# Patient Record
Sex: Male | Born: 1960 | Race: White | Hispanic: No | Marital: Married | Smoking: Never smoker
Health system: Southern US, Community
[De-identification: ages and names within clinical notes are randomized; demographics above are authoritative.]

## PROBLEM LIST (undated history)

## (undated) DIAGNOSIS — G039 Meningitis, unspecified: Secondary | ICD-10-CM

## (undated) HISTORY — PX: SINOSCOPY: SHX187

## (undated) HISTORY — PX: BRAIN SURGERY: SHX531

## (undated) HISTORY — PX: SPINE SURGERY: SHX786

---

## 2001-03-24 ENCOUNTER — Encounter: Payer: Self-pay | Admitting: Internal Medicine

## 2001-03-24 ENCOUNTER — Ambulatory Visit (HOSPITAL_COMMUNITY): Admission: RE | Admit: 2001-03-24 | Discharge: 2001-03-24 | Payer: Self-pay | Admitting: Internal Medicine

## 2001-10-16 ENCOUNTER — Emergency Department (HOSPITAL_COMMUNITY): Admission: EM | Admit: 2001-10-16 | Discharge: 2001-10-16 | Payer: Self-pay | Admitting: Emergency Medicine

## 2005-02-23 ENCOUNTER — Encounter: Admission: RE | Admit: 2005-02-23 | Discharge: 2005-02-23 | Payer: Self-pay | Admitting: Occupational Medicine

## 2006-01-12 ENCOUNTER — Ambulatory Visit (HOSPITAL_BASED_OUTPATIENT_CLINIC_OR_DEPARTMENT_OTHER): Admission: RE | Admit: 2006-01-12 | Discharge: 2006-01-12 | Payer: Self-pay | Admitting: Orthopedic Surgery

## 2006-01-20 ENCOUNTER — Encounter (HOSPITAL_COMMUNITY): Admission: RE | Admit: 2006-01-20 | Discharge: 2006-02-19 | Payer: Self-pay | Admitting: Orthopedic Surgery

## 2006-02-21 ENCOUNTER — Encounter (HOSPITAL_COMMUNITY): Admission: RE | Admit: 2006-02-21 | Discharge: 2006-03-23 | Payer: Self-pay | Admitting: Orthopedic Surgery

## 2006-07-11 ENCOUNTER — Ambulatory Visit (HOSPITAL_COMMUNITY): Admission: RE | Admit: 2006-07-11 | Discharge: 2006-07-11 | Payer: Self-pay | Admitting: Internal Medicine

## 2008-01-23 ENCOUNTER — Ambulatory Visit (HOSPITAL_COMMUNITY): Admission: RE | Admit: 2008-01-23 | Discharge: 2008-01-23 | Payer: Self-pay | Admitting: Internal Medicine

## 2009-01-07 ENCOUNTER — Ambulatory Visit (HOSPITAL_COMMUNITY): Admission: RE | Admit: 2009-01-07 | Discharge: 2009-01-07 | Payer: Self-pay | Admitting: Internal Medicine

## 2009-02-03 ENCOUNTER — Encounter (HOSPITAL_COMMUNITY): Admission: RE | Admit: 2009-02-03 | Discharge: 2009-03-05 | Payer: Self-pay | Admitting: Orthopedic Surgery

## 2010-11-20 NOTE — Op Note (Signed)
Glenn Ramos, Glenn Ramos              ACCOUNT NO.:  [id number]   MEDICAL RECORD NO.:  [id number]          PATIENT TYPE:  AMB   LOCATION:  DSC                          FACILITY:  [facility]   PHYSICIAN:  Loreta Ave, M.D. DATE OF BIRTH:  1960-11-11   DATE OF PROCEDURE:  01/12/2006  DATE OF DISCHARGE:                                 OPERATIVE REPORT   PREOPERATIVE DIAGNOSES:  1.  Right shoulder impingement.  2.  Partial tear in rotator cuff.  3.  Distal clavicle osteolysis.   POSTOPERATIVE DIAGNOSES:  1.  Right shoulder impingement, without full thickness cuff tear.  2.  Partial tear in rotator cuff.  3.  Distal clavicle osteolysis.   PROCEDURE:  1.  Right shoulder exam under anesthesia arthroscopy.  2.  Debridement of rotator cuff.  3.  Bursectomy.  4.  Acromioplasty.  5.  Coracoacromial ligament release.  6.  Excision of distal clavicle.   SURGEON:  Loreta Ave, M.D.   ASSISTANT:  Genene Churn. Denton Meek.   ANESTHESIA:  General.   BLOOD REPLACED:  Minimal.   SPECIMENS:  None.   __________   COMPLICATIONS:  None.   DRESSING:  Soft compression with sling.   DESCRIPTION OF PROCEDURE:  Patient brought to the operating room and placed  on the operating room table in supine position.  After adequate anesthesia  was obtained, right shoulder examined.  Full motion was taken of shoulder.  Placed in the beach chair position and shoulder position and prepped and  draped in the usual sterile fashion.  Three standard portals, anterior and  posterior and lateral.  Shoulder __________  distended and inspected.  The  anterior of the shoulder articular cartilage, capsular, ligamentous  structure, labrum, biceps tendon, biceps anchor are all intact.  All rotator  cuff tendons look good.  From above, typical impingement and abrasive  partial tearing of the top of the cuff supraspinatus tendon in the anterior  aspect.  Impingement from the acromion, thickened bursa and AC joint.   Bursa  was resected, cuff debrided.  No full thickness tears.  Acromioplasty from a  type 3 to a type 1 acromion shaver and high speed bur.  CA ligament resected  out with release with cautery.  Described grade 4 changes.  A periarticular  spur along with a centimeter of clavicle were resected with shaver and a  high speed bur.  Active bleeding __________ decompression, distal clavicle  excision, cuff debridement confirmed with 4 portals.  Instruments are fully  removed.  Out portals closed with #4-0 nylon.  Several compression dressing  applied.  Sling applied.  Anesthesia reversed.  Brought to recovery room.  Tolerated the surgery well.  No complications.      Loreta Ave, M.D.  Electronically Signed     DFM/MEDQ  D:  01/12/2006  T:  01/13/2006  Job:  [id number]

## 2016-01-05 DIAGNOSIS — Z125 Encounter for screening for malignant neoplasm of prostate: Secondary | ICD-10-CM | POA: Diagnosis not present

## 2016-01-05 DIAGNOSIS — Z0001 Encounter for general adult medical examination with abnormal findings: Secondary | ICD-10-CM | POA: Diagnosis not present

## 2016-01-13 DIAGNOSIS — Z0001 Encounter for general adult medical examination with abnormal findings: Secondary | ICD-10-CM | POA: Diagnosis not present

## 2016-01-13 DIAGNOSIS — E785 Hyperlipidemia, unspecified: Secondary | ICD-10-CM | POA: Diagnosis not present

## 2016-01-13 DIAGNOSIS — Z6830 Body mass index (BMI) 30.0-30.9, adult: Secondary | ICD-10-CM | POA: Diagnosis not present

## 2016-01-13 DIAGNOSIS — M722 Plantar fascial fibromatosis: Secondary | ICD-10-CM | POA: Diagnosis not present

## 2016-04-07 DIAGNOSIS — Z23 Encounter for immunization: Secondary | ICD-10-CM | POA: Diagnosis not present

## 2016-04-07 DIAGNOSIS — K219 Gastro-esophageal reflux disease without esophagitis: Secondary | ICD-10-CM | POA: Diagnosis not present

## 2016-05-18 DIAGNOSIS — K219 Gastro-esophageal reflux disease without esophagitis: Secondary | ICD-10-CM | POA: Diagnosis not present

## 2017-01-19 DIAGNOSIS — Z79899 Other long term (current) drug therapy: Secondary | ICD-10-CM | POA: Diagnosis not present

## 2017-01-19 DIAGNOSIS — E785 Hyperlipidemia, unspecified: Secondary | ICD-10-CM | POA: Diagnosis not present

## 2017-01-19 DIAGNOSIS — Z125 Encounter for screening for malignant neoplasm of prostate: Secondary | ICD-10-CM | POA: Diagnosis not present

## 2017-01-19 DIAGNOSIS — G4089 Other seizures: Secondary | ICD-10-CM | POA: Diagnosis not present

## 2017-01-27 DIAGNOSIS — Z6830 Body mass index (BMI) 30.0-30.9, adult: Secondary | ICD-10-CM | POA: Diagnosis not present

## 2017-01-27 DIAGNOSIS — E785 Hyperlipidemia, unspecified: Secondary | ICD-10-CM | POA: Diagnosis not present

## 2017-01-27 DIAGNOSIS — M79671 Pain in right foot: Secondary | ICD-10-CM | POA: Diagnosis not present

## 2017-01-27 DIAGNOSIS — Z0001 Encounter for general adult medical examination with abnormal findings: Secondary | ICD-10-CM | POA: Diagnosis not present

## 2018-02-14 DIAGNOSIS — K219 Gastro-esophageal reflux disease without esophagitis: Secondary | ICD-10-CM | POA: Diagnosis not present

## 2018-02-14 DIAGNOSIS — E785 Hyperlipidemia, unspecified: Secondary | ICD-10-CM | POA: Diagnosis not present

## 2018-02-14 DIAGNOSIS — Z79899 Other long term (current) drug therapy: Secondary | ICD-10-CM | POA: Diagnosis not present

## 2018-02-14 DIAGNOSIS — Z125 Encounter for screening for malignant neoplasm of prostate: Secondary | ICD-10-CM | POA: Diagnosis not present

## 2018-02-20 DIAGNOSIS — E785 Hyperlipidemia, unspecified: Secondary | ICD-10-CM | POA: Diagnosis not present

## 2018-02-20 DIAGNOSIS — Z6831 Body mass index (BMI) 31.0-31.9, adult: Secondary | ICD-10-CM | POA: Diagnosis not present

## 2018-02-20 DIAGNOSIS — Z8669 Personal history of other diseases of the nervous system and sense organs: Secondary | ICD-10-CM | POA: Diagnosis not present

## 2018-02-20 DIAGNOSIS — Z0001 Encounter for general adult medical examination with abnormal findings: Secondary | ICD-10-CM | POA: Diagnosis not present

## 2018-04-06 DIAGNOSIS — Z23 Encounter for immunization: Secondary | ICD-10-CM | POA: Diagnosis not present

## 2018-07-26 ENCOUNTER — Encounter (HOSPITAL_COMMUNITY): Payer: Self-pay | Admitting: *Deleted

## 2018-07-26 ENCOUNTER — Other Ambulatory Visit: Payer: Self-pay

## 2018-07-26 ENCOUNTER — Emergency Department (HOSPITAL_COMMUNITY)
Admission: EM | Admit: 2018-07-26 | Discharge: 2018-07-26 | Disposition: A | Discharge status: Home / Self Care | Payer: No Typology Code available for payment source | Attending: Emergency Medicine | Admitting: Emergency Medicine

## 2018-07-26 DIAGNOSIS — S0502XA Injury of conjunctiva and corneal abrasion without foreign body, left eye, initial encounter: Secondary | ICD-10-CM | POA: Diagnosis not present

## 2018-07-26 DIAGNOSIS — S0501XA Injury of conjunctiva and corneal abrasion without foreign body, right eye, initial encounter: Secondary | ICD-10-CM | POA: Insufficient documentation

## 2018-07-26 DIAGNOSIS — Y999 Unspecified external cause status: Secondary | ICD-10-CM | POA: Diagnosis not present

## 2018-07-26 DIAGNOSIS — Y929 Unspecified place or not applicable: Secondary | ICD-10-CM | POA: Diagnosis not present

## 2018-07-26 DIAGNOSIS — W010XXA Fall on same level from slipping, tripping and stumbling without subsequent striking against object, initial encounter: Secondary | ICD-10-CM | POA: Diagnosis not present

## 2018-07-26 DIAGNOSIS — S0590XA Unspecified injury of unspecified eye and orbit, initial encounter: Secondary | ICD-10-CM

## 2018-07-26 DIAGNOSIS — Z77098 Contact with and (suspected) exposure to other hazardous, chiefly nonmedicinal, chemicals: Secondary | ICD-10-CM | POA: Diagnosis present

## 2018-07-26 DIAGNOSIS — Y9389 Activity, other specified: Secondary | ICD-10-CM | POA: Diagnosis not present

## 2018-07-26 HISTORY — DX: Meningitis, unspecified: G03.9

## 2018-07-26 MED ORDER — TETRACAINE HCL 0.5 % OP SOLN
2.0000 [drp] | Freq: Once | OPHTHALMIC | Status: AC
  Administered 2018-07-26: 2 [drp] via OPHTHALMIC

## 2018-07-26 MED ORDER — TETRACAINE HCL 0.5 % OP SOLN
OPHTHALMIC | Status: AC
  Administered 2018-07-26: 2 [drp] via OPHTHALMIC
  Filled 2018-07-26: qty 4

## 2018-07-26 MED ORDER — HYDROCODONE-ACETAMINOPHEN 5-325 MG PO TABS: 1.0000 | ORAL_TABLET | Freq: Four times a day (QID) | ORAL | 0 refills | Status: AC | PRN

## 2018-07-26 MED ORDER — FLUORESCEIN SODIUM 1 MG OP STRP
ORAL_STRIP | OPHTHALMIC | Status: AC
  Filled 2018-07-26: qty 2

## 2018-07-26 MED ORDER — FLUORESCEIN SODIUM 1 MG OP STRP: 1.0000 | ORAL_STRIP | Freq: Once | OPHTHALMIC | Status: DC

## 2018-07-26 MED ORDER — ERYTHROMYCIN 5 MG/GM OP OINT
TOPICAL_OINTMENT | Freq: Once | OPHTHALMIC | Status: AC
  Administered 2018-07-26: 22:00:00 via OPHTHALMIC
  Filled 2018-07-26: qty 3.5

## 2018-07-26 NOTE — ED Triage Notes (Signed)
Pt reports he works at [employer] and got cationic dye in his bilateral eyes, more in the left. Pt denies blurry vision. Pt reports extreme pain with opening his eyes. Pt has bluish discoloration around his eyes which he reports is the color of the cationic dye. Pt reports he rinsed his eyes out at work with water for approximately 5 minutes prior to coming to ED.

## 2018-07-26 NOTE — ED Notes (Signed)
Bilateral eyes irrigated for second time to to low ph

## 2018-07-26 NOTE — ED Provider Notes (Signed)
[facility] EMERGENCY DEPARTMENT Provider Note   CSN: [id number] Arrival date & time: 07/26/18  1833     History   Chief Complaint Chief Complaint  Patient presents with  . Eye Injury    HPI Glenn Ramos is a 58 y.o. male.  Patient tripped and fell and splashed a blue dye into both of his eyes.  He complains of pain to both eyes.  They irrigated his eyes at work.  Poison control was called and said he needed to irrigate his eyes more.  The history is provided by the patient. No language interpreter was used.  Eye Injury  This is a new problem. The current episode started less than 1 hour ago. The problem occurs rarely. The problem has been resolved. Pertinent negatives include no chest pain, no abdominal pain and no headaches. Nothing aggravates the symptoms. Nothing relieves the symptoms. He has tried nothing for the symptoms. The treatment provided no relief.    Past Medical History:  Diagnosis Date  . Meningitis spinal     There are no active problems to display for this patient.   Past Surgical History:  Procedure Laterality Date  . BRAIN SURGERY    . SPINE SURGERY          Home Medications    Prior to Admission medications   Medication Sig Start Date End Date Taking? Authorizing Provider  HYDROcodone-acetaminophen (NORCO/VICODIN) 5-325 MG tablet Take 1 tablet by mouth every 6 (six) hours as needed for moderate pain. 07/26/18   Bethann Berkshire, MD    Family History No family history on file.  Social History Social History   Tobacco Use  . Smoking status: Never Smoker  . Smokeless tobacco: Never Used  Substance Use Topics  . Alcohol use: Never    Frequency: Never  . Drug use: Never     Allergies   Patient has no known allergies.   Review of Systems Review of Systems  Constitutional: Negative for appetite change and fatigue.  HENT: Negative for congestion, ear discharge and sinus pressure.        Pain in both eyes  Eyes: Negative for  discharge.  Respiratory: Negative for cough.   Cardiovascular: Negative for chest pain.  Gastrointestinal: Negative for abdominal pain and diarrhea.  Genitourinary: Negative for frequency and hematuria.  Musculoskeletal: Negative for back pain.  Skin: Negative for rash.  Neurological: Negative for seizures and headaches.  Psychiatric/Behavioral: Negative for hallucinations.     Physical Exam Updated Vital Signs BP (!) 122/94 (BP Location: Right Arm)   Pulse 80   Temp 98 F (36.7 C) (Oral)   Resp 16   Ht 5\' 8"  (1.727 m)   Wt 88.5 kg   SpO2 99%   BMI 29.65 kg/m   Physical Exam Vitals signs and nursing note reviewed.  Constitutional:      Appearance: He is well-developed.  HENT:     Head: Normocephalic.     Nose: Nose normal.  Eyes:     Comments: Bilateral conjunctiva inflamed.  Pupils 3 mm and reactive.  Patient states he no longer has any blurred vision.  Small amount of blue dye in conjunctiva both eyes.  Large corneal abrasion sent over his left eye and small corneal abrasion on the right eye  Neck:     Musculoskeletal: Neck supple.     Thyroid: No thyromegaly.  Cardiovascular:     Rate and Rhythm: Normal rate and regular rhythm.     Heart sounds: No murmur.  No friction rub. No gallop.   Pulmonary:     Breath sounds: No stridor. No wheezing or rales.  Chest:     Chest wall: No tenderness.  Abdominal:     General: There is no distension.     Tenderness: There is no abdominal tenderness. There is no rebound.  Musculoskeletal: Normal range of motion.  Lymphadenopathy:     Cervical: No cervical adenopathy.  Skin:    Findings: No erythema or rash.  Neurological:     Mental Status: He is oriented to person, place, and time.     Motor: No abnormal muscle tone.     Coordination: Coordination normal.  Psychiatric:        Behavior: Behavior normal.      ED Treatments / Results  Labs (all labs ordered are listed, but only abnormal results are displayed) Labs  Reviewed - No data to display  EKG None  Radiology No results found.  Procedures Procedures (including critical care time)  Medications Ordered in ED Medications  fluorescein ophthalmic strip 1 strip (has no administration in time range)  erythromycin ophthalmic ointment (has no administration in time range)  tetracaine (PONTOCAINE) 0.5 % ophthalmic solution 2 drop (2 drops Both Eyes Given 07/26/18 1911)     Initial Impression / Assessment and Plan / ED Course  I have reviewed the triage vital signs and the nursing notes.  Pertinent labs & imaging results that were available during my care of the patient were reviewed by me and considered in my medical decision making (see chart for details).    Patient's eyes were irrigated significantly both eyes had 2 L of irrigation.  Patient's pH is 7-8 after irrigation of 2 L per.  Erythromycin ointment was applied to both eyes and he will use it every 2 hours and he will follow-up with the ophthalmologist tomorrow.  Final Clinical Impressions(s) / ED Diagnoses   Final diagnoses:  Eye injury, initial encounter    ED Discharge Orders         Ordered    HYDROcodone-acetaminophen (NORCO/VICODIN) 5-325 MG tablet  Every 6 hours PRN     07/26/18 2141           Bethann BerkshireZammit, Nataly Pacifico, MD 07/26/18 2146

## 2018-07-26 NOTE — ED Notes (Signed)
Dr. Zammit notified of pt. 

## 2018-07-26 NOTE — ED Notes (Addendum)
Posion control called. Posion control recommends pt's eyes be irrigated with the morgan lenses with 500-105700ml of NS. Afterwards, check for surface injury and check pH in eyes 30 minutes after the irrigation is completed. If injury has occurred, appropriate antibiotics need to be started. Pain management needs to be addressed. It is highly recommended that pt see opthalmology tonight. If this is not possible, pt should follow up with them tomorrow.

## 2018-07-26 NOTE — ED Notes (Signed)
Morgan lens applied to bilateral eyes

## 2018-07-26 NOTE — Discharge Instructions (Addendum)
Use the ointment every 2 hours to both eyes.  Call Dr. Laruth Bouchard office in the morning and get an appointment for tomorrow.  Tell the office that you were seen in the emergency department last night and Dr. Dione Booze wanted you seen today

## 2018-07-27 MED FILL — Hydrocodone-Acetaminophen Tab 5-325 MG: ORAL | Qty: 6 | Status: AC

## 2018-11-06 DIAGNOSIS — M25512 Pain in left shoulder: Secondary | ICD-10-CM | POA: Diagnosis not present

## 2018-12-07 DIAGNOSIS — N3000 Acute cystitis without hematuria: Secondary | ICD-10-CM | POA: Diagnosis not present

## 2018-12-07 DIAGNOSIS — N390 Urinary tract infection, site not specified: Secondary | ICD-10-CM | POA: Diagnosis not present

## 2018-12-11 ENCOUNTER — Other Ambulatory Visit (HOSPITAL_COMMUNITY): Payer: Self-pay | Admitting: Internal Medicine

## 2018-12-11 ENCOUNTER — Other Ambulatory Visit: Payer: Self-pay | Admitting: Internal Medicine

## 2018-12-11 DIAGNOSIS — N200 Calculus of kidney: Secondary | ICD-10-CM

## 2018-12-14 ENCOUNTER — Other Ambulatory Visit (HOSPITAL_COMMUNITY): Payer: Self-pay | Admitting: Internal Medicine

## 2018-12-14 ENCOUNTER — Other Ambulatory Visit: Payer: Self-pay

## 2018-12-14 ENCOUNTER — Ambulatory Visit (HOSPITAL_COMMUNITY)
Admission: RE | Admit: 2018-12-14 | Discharge: 2018-12-14 | Disposition: A | Discharge status: Home / Self Care | Payer: BC Managed Care – PPO | Source: Ambulatory Visit | Attending: Internal Medicine | Admitting: Internal Medicine

## 2018-12-14 DIAGNOSIS — N200 Calculus of kidney: Secondary | ICD-10-CM

## 2018-12-14 DIAGNOSIS — N201 Calculus of ureter: Secondary | ICD-10-CM | POA: Diagnosis not present

## 2018-12-14 MED ORDER — SODIUM CHLORIDE (PF) 0.9 % IJ SOLN
INTRAMUSCULAR | Status: AC
  Filled 2018-12-14: qty 50

## 2018-12-18 DIAGNOSIS — M25512 Pain in left shoulder: Secondary | ICD-10-CM | POA: Diagnosis not present

## 2018-12-23 DIAGNOSIS — M25512 Pain in left shoulder: Secondary | ICD-10-CM | POA: Diagnosis not present

## 2018-12-25 DIAGNOSIS — M25512 Pain in left shoulder: Secondary | ICD-10-CM | POA: Diagnosis not present

## 2019-01-04 DIAGNOSIS — N201 Calculus of ureter: Secondary | ICD-10-CM | POA: Diagnosis not present

## 2019-01-25 DIAGNOSIS — M19012 Primary osteoarthritis, left shoulder: Secondary | ICD-10-CM | POA: Diagnosis not present

## 2019-01-25 DIAGNOSIS — G8918 Other acute postprocedural pain: Secondary | ICD-10-CM | POA: Diagnosis not present

## 2019-01-25 DIAGNOSIS — M7522 Bicipital tendinitis, left shoulder: Secondary | ICD-10-CM | POA: Diagnosis not present

## 2019-01-25 DIAGNOSIS — M75122 Complete rotator cuff tear or rupture of left shoulder, not specified as traumatic: Secondary | ICD-10-CM | POA: Diagnosis not present

## 2019-01-25 DIAGNOSIS — S46012A Strain of muscle(s) and tendon(s) of the rotator cuff of left shoulder, initial encounter: Secondary | ICD-10-CM | POA: Diagnosis not present

## 2019-01-25 DIAGNOSIS — M7542 Impingement syndrome of left shoulder: Secondary | ICD-10-CM | POA: Diagnosis not present

## 2019-01-25 DIAGNOSIS — M24112 Other articular cartilage disorders, left shoulder: Secondary | ICD-10-CM | POA: Diagnosis not present

## 2019-02-05 DIAGNOSIS — M19012 Primary osteoarthritis, left shoulder: Secondary | ICD-10-CM | POA: Diagnosis not present

## 2019-02-09 DIAGNOSIS — M6281 Muscle weakness (generalized): Secondary | ICD-10-CM | POA: Diagnosis not present

## 2019-02-09 DIAGNOSIS — M25512 Pain in left shoulder: Secondary | ICD-10-CM | POA: Diagnosis not present

## 2019-02-09 DIAGNOSIS — M25612 Stiffness of left shoulder, not elsewhere classified: Secondary | ICD-10-CM | POA: Diagnosis not present

## 2019-02-09 DIAGNOSIS — M75122 Complete rotator cuff tear or rupture of left shoulder, not specified as traumatic: Secondary | ICD-10-CM | POA: Diagnosis not present

## 2019-02-16 DIAGNOSIS — M25512 Pain in left shoulder: Secondary | ICD-10-CM | POA: Diagnosis not present

## 2019-02-16 DIAGNOSIS — M75122 Complete rotator cuff tear or rupture of left shoulder, not specified as traumatic: Secondary | ICD-10-CM | POA: Diagnosis not present

## 2019-02-16 DIAGNOSIS — M25612 Stiffness of left shoulder, not elsewhere classified: Secondary | ICD-10-CM | POA: Diagnosis not present

## 2019-02-16 DIAGNOSIS — M6281 Muscle weakness (generalized): Secondary | ICD-10-CM | POA: Diagnosis not present

## 2019-02-22 DIAGNOSIS — M25612 Stiffness of left shoulder, not elsewhere classified: Secondary | ICD-10-CM | POA: Diagnosis not present

## 2019-02-22 DIAGNOSIS — M25512 Pain in left shoulder: Secondary | ICD-10-CM | POA: Diagnosis not present

## 2019-02-22 DIAGNOSIS — M75122 Complete rotator cuff tear or rupture of left shoulder, not specified as traumatic: Secondary | ICD-10-CM | POA: Diagnosis not present

## 2019-02-22 DIAGNOSIS — M6281 Muscle weakness (generalized): Secondary | ICD-10-CM | POA: Diagnosis not present

## 2019-03-01 DIAGNOSIS — M6281 Muscle weakness (generalized): Secondary | ICD-10-CM | POA: Diagnosis not present

## 2019-03-01 DIAGNOSIS — M75122 Complete rotator cuff tear or rupture of left shoulder, not specified as traumatic: Secondary | ICD-10-CM | POA: Diagnosis not present

## 2019-03-01 DIAGNOSIS — M25512 Pain in left shoulder: Secondary | ICD-10-CM | POA: Diagnosis not present

## 2019-03-01 DIAGNOSIS — M25612 Stiffness of left shoulder, not elsewhere classified: Secondary | ICD-10-CM | POA: Diagnosis not present

## 2019-03-05 DIAGNOSIS — M25512 Pain in left shoulder: Secondary | ICD-10-CM | POA: Diagnosis not present

## 2019-03-05 DIAGNOSIS — M6281 Muscle weakness (generalized): Secondary | ICD-10-CM | POA: Diagnosis not present

## 2019-03-05 DIAGNOSIS — M75122 Complete rotator cuff tear or rupture of left shoulder, not specified as traumatic: Secondary | ICD-10-CM | POA: Diagnosis not present

## 2019-03-05 DIAGNOSIS — M25612 Stiffness of left shoulder, not elsewhere classified: Secondary | ICD-10-CM | POA: Diagnosis not present

## 2019-03-09 DIAGNOSIS — M75122 Complete rotator cuff tear or rupture of left shoulder, not specified as traumatic: Secondary | ICD-10-CM | POA: Diagnosis not present

## 2019-03-09 DIAGNOSIS — M25512 Pain in left shoulder: Secondary | ICD-10-CM | POA: Diagnosis not present

## 2019-03-09 DIAGNOSIS — M6281 Muscle weakness (generalized): Secondary | ICD-10-CM | POA: Diagnosis not present

## 2019-03-09 DIAGNOSIS — M25612 Stiffness of left shoulder, not elsewhere classified: Secondary | ICD-10-CM | POA: Diagnosis not present

## 2019-03-13 DIAGNOSIS — M75122 Complete rotator cuff tear or rupture of left shoulder, not specified as traumatic: Secondary | ICD-10-CM | POA: Diagnosis not present

## 2019-03-13 DIAGNOSIS — M25612 Stiffness of left shoulder, not elsewhere classified: Secondary | ICD-10-CM | POA: Diagnosis not present

## 2019-03-13 DIAGNOSIS — M25512 Pain in left shoulder: Secondary | ICD-10-CM | POA: Diagnosis not present

## 2019-03-13 DIAGNOSIS — M6281 Muscle weakness (generalized): Secondary | ICD-10-CM | POA: Diagnosis not present

## 2019-03-19 DIAGNOSIS — M75122 Complete rotator cuff tear or rupture of left shoulder, not specified as traumatic: Secondary | ICD-10-CM | POA: Diagnosis not present

## 2019-03-19 DIAGNOSIS — M6281 Muscle weakness (generalized): Secondary | ICD-10-CM | POA: Diagnosis not present

## 2019-03-19 DIAGNOSIS — M25612 Stiffness of left shoulder, not elsewhere classified: Secondary | ICD-10-CM | POA: Diagnosis not present

## 2019-03-19 DIAGNOSIS — M25512 Pain in left shoulder: Secondary | ICD-10-CM | POA: Diagnosis not present

## 2019-03-22 DIAGNOSIS — M25612 Stiffness of left shoulder, not elsewhere classified: Secondary | ICD-10-CM | POA: Diagnosis not present

## 2019-03-22 DIAGNOSIS — M75122 Complete rotator cuff tear or rupture of left shoulder, not specified as traumatic: Secondary | ICD-10-CM | POA: Diagnosis not present

## 2019-03-22 DIAGNOSIS — M25512 Pain in left shoulder: Secondary | ICD-10-CM | POA: Diagnosis not present

## 2019-03-22 DIAGNOSIS — M6281 Muscle weakness (generalized): Secondary | ICD-10-CM | POA: Diagnosis not present

## 2019-04-03 DIAGNOSIS — M75122 Complete rotator cuff tear or rupture of left shoulder, not specified as traumatic: Secondary | ICD-10-CM | POA: Diagnosis not present

## 2019-04-03 DIAGNOSIS — M6281 Muscle weakness (generalized): Secondary | ICD-10-CM | POA: Diagnosis not present

## 2019-04-03 DIAGNOSIS — M25612 Stiffness of left shoulder, not elsewhere classified: Secondary | ICD-10-CM | POA: Diagnosis not present

## 2019-04-03 DIAGNOSIS — M25512 Pain in left shoulder: Secondary | ICD-10-CM | POA: Diagnosis not present

## 2019-04-05 DIAGNOSIS — M25512 Pain in left shoulder: Secondary | ICD-10-CM | POA: Diagnosis not present

## 2019-04-05 DIAGNOSIS — M25612 Stiffness of left shoulder, not elsewhere classified: Secondary | ICD-10-CM | POA: Diagnosis not present

## 2019-04-05 DIAGNOSIS — M6281 Muscle weakness (generalized): Secondary | ICD-10-CM | POA: Diagnosis not present

## 2019-04-05 DIAGNOSIS — M75122 Complete rotator cuff tear or rupture of left shoulder, not specified as traumatic: Secondary | ICD-10-CM | POA: Diagnosis not present

## 2019-04-10 DIAGNOSIS — M25512 Pain in left shoulder: Secondary | ICD-10-CM | POA: Diagnosis not present

## 2019-04-10 DIAGNOSIS — M6281 Muscle weakness (generalized): Secondary | ICD-10-CM | POA: Diagnosis not present

## 2019-04-10 DIAGNOSIS — M75122 Complete rotator cuff tear or rupture of left shoulder, not specified as traumatic: Secondary | ICD-10-CM | POA: Diagnosis not present

## 2019-04-10 DIAGNOSIS — M25612 Stiffness of left shoulder, not elsewhere classified: Secondary | ICD-10-CM | POA: Diagnosis not present

## 2019-04-13 DIAGNOSIS — M25512 Pain in left shoulder: Secondary | ICD-10-CM | POA: Diagnosis not present

## 2019-04-13 DIAGNOSIS — M6281 Muscle weakness (generalized): Secondary | ICD-10-CM | POA: Diagnosis not present

## 2019-04-13 DIAGNOSIS — M75122 Complete rotator cuff tear or rupture of left shoulder, not specified as traumatic: Secondary | ICD-10-CM | POA: Diagnosis not present

## 2019-04-13 DIAGNOSIS — M25612 Stiffness of left shoulder, not elsewhere classified: Secondary | ICD-10-CM | POA: Diagnosis not present

## 2019-04-16 DIAGNOSIS — M75122 Complete rotator cuff tear or rupture of left shoulder, not specified as traumatic: Secondary | ICD-10-CM | POA: Diagnosis not present

## 2019-04-16 DIAGNOSIS — M6281 Muscle weakness (generalized): Secondary | ICD-10-CM | POA: Diagnosis not present

## 2019-04-16 DIAGNOSIS — M25512 Pain in left shoulder: Secondary | ICD-10-CM | POA: Diagnosis not present

## 2019-04-16 DIAGNOSIS — M25612 Stiffness of left shoulder, not elsewhere classified: Secondary | ICD-10-CM | POA: Diagnosis not present

## 2019-04-17 DIAGNOSIS — Z23 Encounter for immunization: Secondary | ICD-10-CM | POA: Diagnosis not present

## 2019-04-20 DIAGNOSIS — M6281 Muscle weakness (generalized): Secondary | ICD-10-CM | POA: Diagnosis not present

## 2019-04-20 DIAGNOSIS — M25612 Stiffness of left shoulder, not elsewhere classified: Secondary | ICD-10-CM | POA: Diagnosis not present

## 2019-04-20 DIAGNOSIS — M75122 Complete rotator cuff tear or rupture of left shoulder, not specified as traumatic: Secondary | ICD-10-CM | POA: Diagnosis not present

## 2019-04-20 DIAGNOSIS — M25512 Pain in left shoulder: Secondary | ICD-10-CM | POA: Diagnosis not present

## 2019-04-23 DIAGNOSIS — M6281 Muscle weakness (generalized): Secondary | ICD-10-CM | POA: Diagnosis not present

## 2019-04-23 DIAGNOSIS — M25612 Stiffness of left shoulder, not elsewhere classified: Secondary | ICD-10-CM | POA: Diagnosis not present

## 2019-04-23 DIAGNOSIS — M75122 Complete rotator cuff tear or rupture of left shoulder, not specified as traumatic: Secondary | ICD-10-CM | POA: Diagnosis not present

## 2019-04-23 DIAGNOSIS — M25512 Pain in left shoulder: Secondary | ICD-10-CM | POA: Diagnosis not present

## 2019-04-27 DIAGNOSIS — E785 Hyperlipidemia, unspecified: Secondary | ICD-10-CM | POA: Diagnosis not present

## 2019-04-27 DIAGNOSIS — Z79899 Other long term (current) drug therapy: Secondary | ICD-10-CM | POA: Diagnosis not present

## 2019-04-27 DIAGNOSIS — G4089 Other seizures: Secondary | ICD-10-CM | POA: Diagnosis not present

## 2019-04-27 DIAGNOSIS — K219 Gastro-esophageal reflux disease without esophagitis: Secondary | ICD-10-CM | POA: Diagnosis not present

## 2019-05-04 DIAGNOSIS — G4089 Other seizures: Secondary | ICD-10-CM | POA: Diagnosis not present

## 2019-05-04 DIAGNOSIS — M6281 Muscle weakness (generalized): Secondary | ICD-10-CM | POA: Diagnosis not present

## 2019-05-04 DIAGNOSIS — E785 Hyperlipidemia, unspecified: Secondary | ICD-10-CM | POA: Diagnosis not present

## 2019-05-04 DIAGNOSIS — N200 Calculus of kidney: Secondary | ICD-10-CM | POA: Diagnosis not present

## 2019-05-04 DIAGNOSIS — M25612 Stiffness of left shoulder, not elsewhere classified: Secondary | ICD-10-CM | POA: Diagnosis not present

## 2019-05-04 DIAGNOSIS — K219 Gastro-esophageal reflux disease without esophagitis: Secondary | ICD-10-CM | POA: Diagnosis not present

## 2019-05-04 DIAGNOSIS — M25512 Pain in left shoulder: Secondary | ICD-10-CM | POA: Diagnosis not present

## 2019-05-04 DIAGNOSIS — M75122 Complete rotator cuff tear or rupture of left shoulder, not specified as traumatic: Secondary | ICD-10-CM | POA: Diagnosis not present

## 2019-05-11 DIAGNOSIS — M6281 Muscle weakness (generalized): Secondary | ICD-10-CM | POA: Diagnosis not present

## 2019-05-11 DIAGNOSIS — M75122 Complete rotator cuff tear or rupture of left shoulder, not specified as traumatic: Secondary | ICD-10-CM | POA: Diagnosis not present

## 2019-05-11 DIAGNOSIS — M25612 Stiffness of left shoulder, not elsewhere classified: Secondary | ICD-10-CM | POA: Diagnosis not present

## 2019-05-11 DIAGNOSIS — M25512 Pain in left shoulder: Secondary | ICD-10-CM | POA: Diagnosis not present

## 2019-05-25 DIAGNOSIS — M25512 Pain in left shoulder: Secondary | ICD-10-CM | POA: Diagnosis not present

## 2019-09-27 DIAGNOSIS — Z23 Encounter for immunization: Secondary | ICD-10-CM | POA: Diagnosis not present

## 2019-10-20 DIAGNOSIS — Z23 Encounter for immunization: Secondary | ICD-10-CM | POA: Diagnosis not present

## 2020-04-17 ENCOUNTER — Ambulatory Visit
Admission: EM | Admit: 2020-04-17 | Discharge: 2020-04-17 | Disposition: A | Discharge status: Home / Self Care | Payer: BC Managed Care – PPO | Attending: Emergency Medicine | Admitting: Emergency Medicine

## 2020-04-17 ENCOUNTER — Ambulatory Visit: Payer: Self-pay

## 2020-04-17 DIAGNOSIS — L235 Allergic contact dermatitis due to other chemical products: Secondary | ICD-10-CM

## 2020-04-17 MED ORDER — PREDNISONE 10 MG (21) PO TBPK: ORAL_TABLET | ORAL | 0 refills | Status: DC

## 2020-04-17 MED ORDER — DEXAMETHASONE SODIUM PHOSPHATE 10 MG/ML IJ SOLN
10.0000 mg | Freq: Once | INTRAMUSCULAR | Status: AC
  Administered 2020-04-17: 10 mg via INTRAMUSCULAR

## 2020-04-17 MED ORDER — CETIRIZINE HCL 10 MG PO TABS: 10.0000 mg | ORAL_TABLET | Freq: Every day | ORAL | 0 refills | Status: AC

## 2020-04-17 NOTE — ED Triage Notes (Signed)
Pt presents with bilateral hand swelling and blistering, skin is red and hot to touch

## 2020-04-17 NOTE — Discharge Instructions (Addendum)
Prescribed zyrtec andprednisone Take as prescribed and to completion Limit hot shower and baths, or bathe with warm water.   Moisturize skin daily Follow up with PCP if symptoms persists Return or go to the ER if you have any new or worsening symptoms 

## 2020-04-17 NOTE — ED Provider Notes (Signed)
[facility] CARE [facility]ENTER   [id number] 04/17/20 Arrival Time: 4158  Chief Complaint  Patient presents with  . Joint Swelling     SUBJECTIVE: History from: patient.  Glenn Ramos is a 59 y.o. male who presented to the urgent care with a complaint of bilateral hand swelling and blister and rash for the past 3 to 4 days.  Described the rash as red and itching.  Localized rash and blistering to wrist and hand.  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  He localizes discomfort to bilateral arm.  He has tried OTC Benadryl without relief.  Denies aggravating or alleviating factors.  He denies similar symptoms in the past.  Denies chills, fever, nausea, vomiting, diarrhea.  ROS: As per HPI.  All other pertinent ROS negative.     Past Medical History:  Diagnosis Date  . Meningitis spinal    Past Surgical History:  Procedure Laterality Date  . BRAIN SURGERY    . SPINE SURGERY     No Known Allergies No current facility-administered medications on file prior to encounter.   Current Outpatient Medications on File Prior to Encounter  Medication Sig Dispense Refill  . HYDROcodone-acetaminophen (NORCO/VICODIN) 5-325 MG tablet Take 1 tablet by mouth every 6 (six) hours as needed for moderate pain. 6 tablet 0   Social History   Socioeconomic History  . Marital status: Divorced    Spouse name: Not on file  . Number of children: Not on file  . Years of education: Not on file  . Highest education level: Not on file  Occupational History  . Not on file  Tobacco Use  . Smoking status: Never Smoker  . Smokeless tobacco: Never Used  Substance and Sexual Activity  . Alcohol use: Never  . Drug use: Never  . Sexual activity: Not on file  Other Topics Concern  . Not on file  Social History Narrative  . Not on file   Social Determinants of Health   Financial Resource Strain:   . Difficulty of Paying Living Expenses: Not on file  Food Insecurity:   . Worried  About Programme researcher, broadcasting/film/video in the Last Year: Not on file  . Ran Out of Food in the Last Year: Not on file  Transportation Needs:   . Lack of Transportation (Medical): Not on file  . Lack of Transportation (Non-Medical): Not on file  Physical Activity:   . Days of Exercise per Week: Not on file  . Minutes of Exercise per Session: Not on file  Stress:   . Feeling of Stress : Not on file  Social Connections:   . Frequency of Communication with Friends and Family: Not on file  . Frequency of Social Gatherings with Friends and Family: Not on file  . Attends Religious Services: Not on file  . Active Member of Clubs or Organizations: Not on file  . Attends Banker Meetings: Not on file  . Marital Status: Not on file  Intimate Partner Violence:   . Fear of Current or Ex-Partner: Not on file  . Emotionally Abused: Not on file  . Physically Abused: Not on file  . Sexually Abused: Not on file   History reviewed. No pertinent family history.  OBJECTIVE:  Vitals:   04/17/20 0853  BP: 125/82  Pulse: 71  Resp: 18  Temp: 98.5 F (36.9 C)  SpO2: 96%     Physical Exam Vitals and nursing note reviewed.  Constitutional:  General: He is not in acute distress.    Appearance: Normal appearance. He is normal weight. He is not ill-appearing, toxic-appearing or diaphoretic.  Cardiovascular:     Rate and Rhythm: Normal rate and regular rhythm.     Pulses: Normal pulses.     Heart sounds: Normal heart sounds. No murmur heard.  No friction rub. No gallop.   Pulmonary:     Effort: Pulmonary effort is normal. No respiratory distress.     Breath sounds: Normal breath sounds. No stridor. No wheezing, rhonchi or rales.  Chest:     Chest wall: No tenderness.  Skin:    General: Skin is warm.     Findings: Rash present. Rash is macular and vesicular.  Neurological:     Mental Status: He is alert and oriented to person, place, and time.     LABS:  No results found for this or  any previous visit (from the past 24 hour(s)).   ASSESSMENT & PLAN:  1. Allergic dermatitis due to other chemical product     Meds ordered this encounter  Medications  . predniSONE (STERAPRED UNI-PAK 21 TAB) 10 MG (21) TBPK tablet    Sig: Take 6 tabs by mouth daily  for 1 days, then 5 tabs for 1 days, then 4 tabs for 1 days, then 3 tabs for 1 days, 2 tabs for 1 days, then 1 tab by mouth daily for 1 days    Dispense:  21 tablet    Refill:  0  . cetirizine (ZYRTEC ALLERGY) 10 MG tablet    Sig: Take 1 tablet (10 mg total) by mouth daily.    Dispense:  30 tablet    Refill:  0  . dexamethasone (DECADRON) injection 10 mg    Discharge instructions  Prescribed zyrtec and prednisone Take as prescribed and to completion Limit hot shower and baths, or bathe with warm water.   Moisturize skin daily Follow up with PCP if symptoms persists Return or go to the ER if you have any new or worsening symptoms  Reviewed expectations re: course of current medical issues. Questions answered. Outlined signs and symptoms indicating need for more acute intervention. Patient verbalized understanding. After Visit Summary given.         Durward Parcel, FNP 04/17/20 [phone]

## 2020-05-09 DIAGNOSIS — Z79899 Other long term (current) drug therapy: Secondary | ICD-10-CM | POA: Diagnosis not present

## 2020-05-09 DIAGNOSIS — G4089 Other seizures: Secondary | ICD-10-CM | POA: Diagnosis not present

## 2020-05-09 DIAGNOSIS — K219 Gastro-esophageal reflux disease without esophagitis: Secondary | ICD-10-CM | POA: Diagnosis not present

## 2020-05-09 DIAGNOSIS — E785 Hyperlipidemia, unspecified: Secondary | ICD-10-CM | POA: Diagnosis not present

## 2020-05-16 DIAGNOSIS — Z0000 Encounter for general adult medical examination without abnormal findings: Secondary | ICD-10-CM | POA: Diagnosis not present

## 2020-05-16 DIAGNOSIS — N200 Calculus of kidney: Secondary | ICD-10-CM | POA: Diagnosis not present

## 2020-05-16 DIAGNOSIS — Z23 Encounter for immunization: Secondary | ICD-10-CM | POA: Diagnosis not present

## 2020-05-16 DIAGNOSIS — Z0001 Encounter for general adult medical examination with abnormal findings: Secondary | ICD-10-CM | POA: Diagnosis not present

## 2020-05-16 DIAGNOSIS — K219 Gastro-esophageal reflux disease without esophagitis: Secondary | ICD-10-CM | POA: Diagnosis not present

## 2020-05-16 DIAGNOSIS — L509 Urticaria, unspecified: Secondary | ICD-10-CM | POA: Diagnosis not present

## 2020-11-21 DIAGNOSIS — M25552 Pain in left hip: Secondary | ICD-10-CM | POA: Diagnosis not present

## 2020-12-03 DIAGNOSIS — M25552 Pain in left hip: Secondary | ICD-10-CM | POA: Diagnosis not present

## 2021-05-06 IMAGING — CT CT ABDOMEN AND PELVIS WITHOUT CONTRAST
2 of 4 series · 16 of 46 positions shown, 18 images · non-contrast
Comparison: None.

CLINICAL DATA: Left-sided flank pain with hematuria.

EXAM:
CT ABDOMEN AND PELVIS WITHOUT CONTRAST
TECHNIQUE: Multidetector CT imaging of the abdomen and pelvis was performed
following the standard protocol without IV contrast.

[Series 2: axial st · axial · 0.76mm/px · z∈[-445,-45]mm · 13 of 92 slices shown, 15 images]
[im 6/92  soft-tissue]
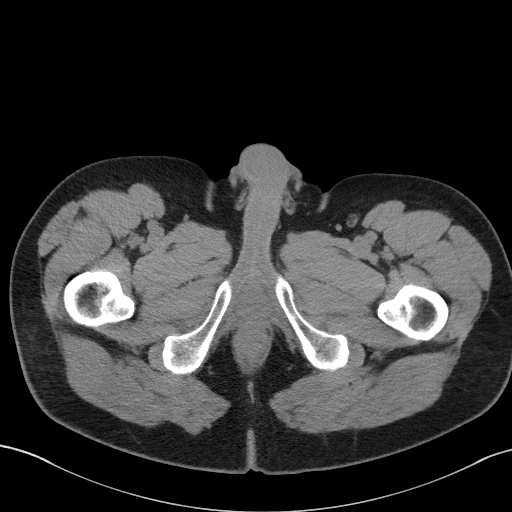
[im 6/92  bone]
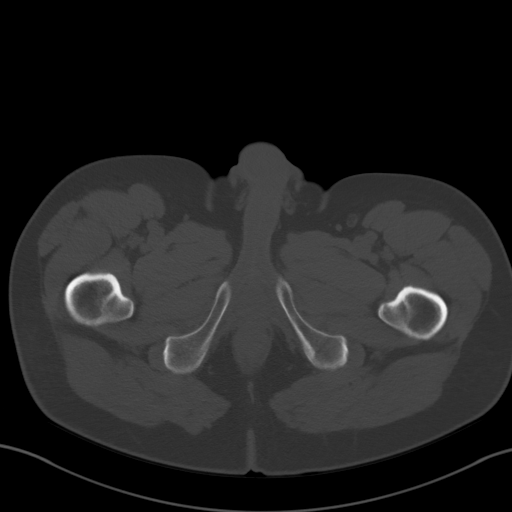
[im 11/92  soft-tissue]
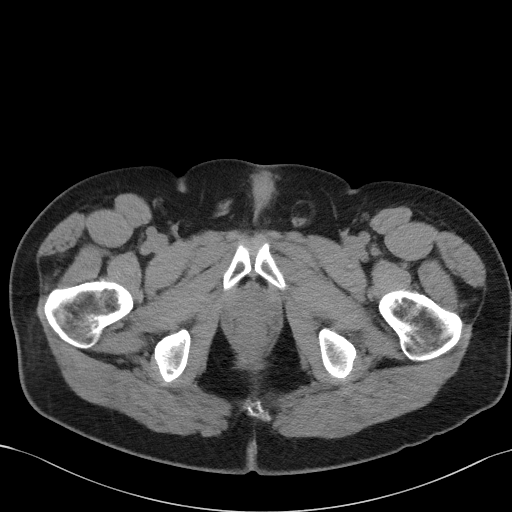
[im 22/92  soft-tissue]
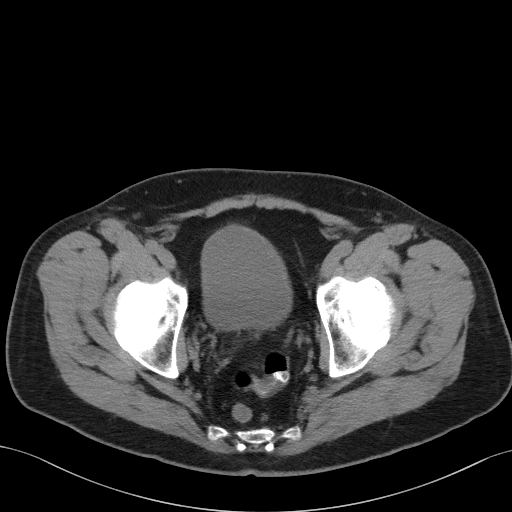
[im 27/92  soft-tissue]
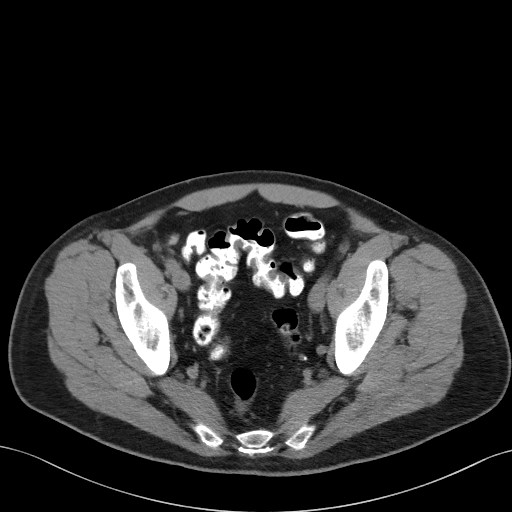
[im 33/92  soft-tissue]
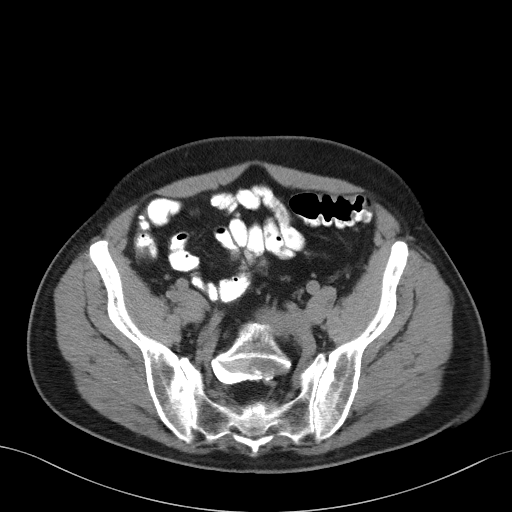
[im 38/92  soft-tissue]
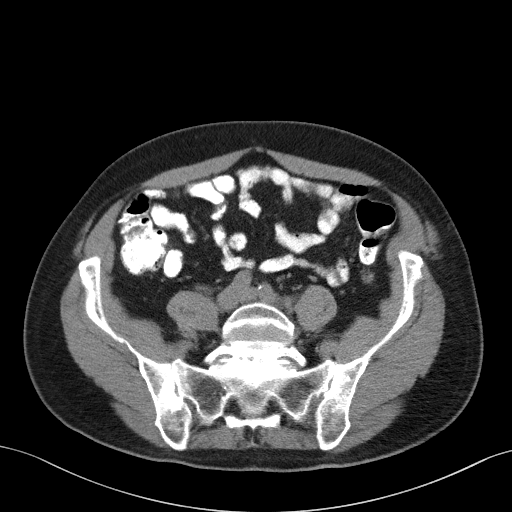
[im 49/92  soft-tissue]
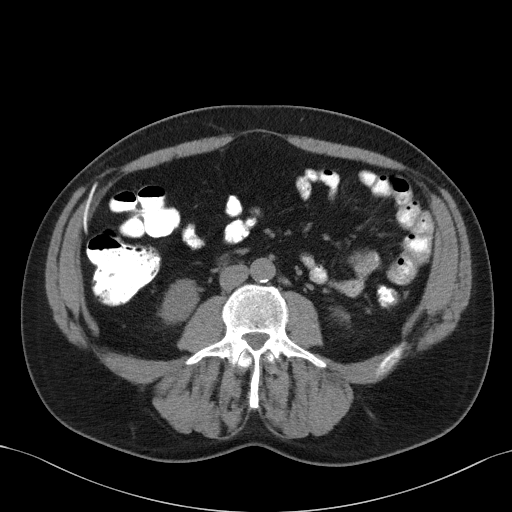
[im 54/92  soft-tissue]
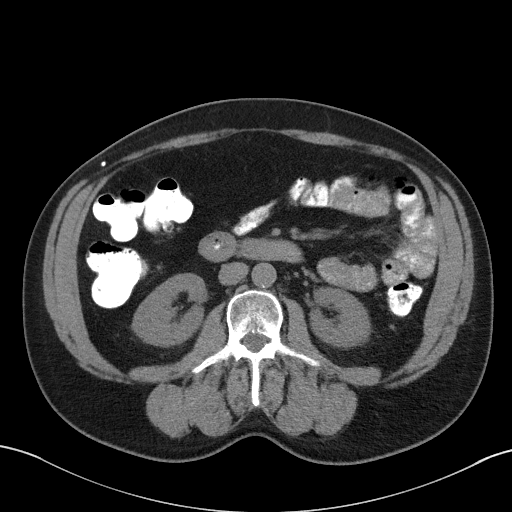
[im 59/92  soft-tissue]
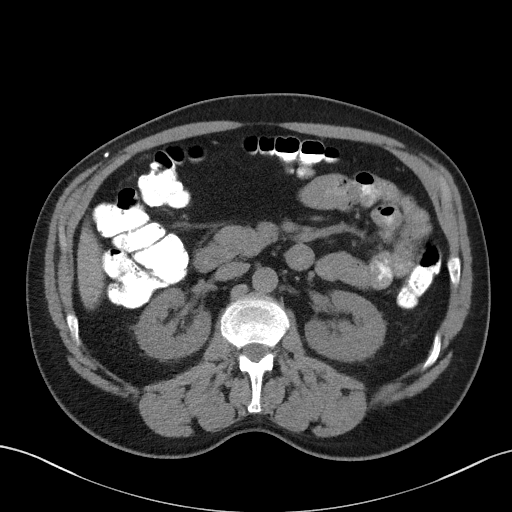
[im 59/92  bone]
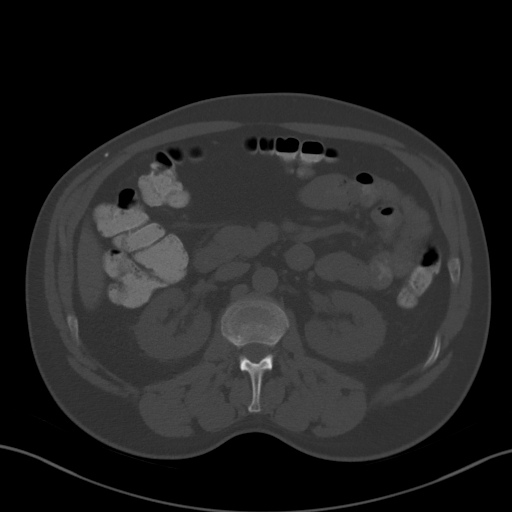
[im 65/92  soft-tissue]
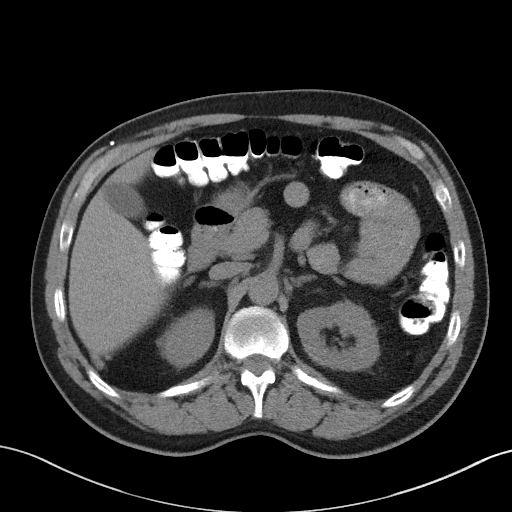
[im 70/92  soft-tissue]
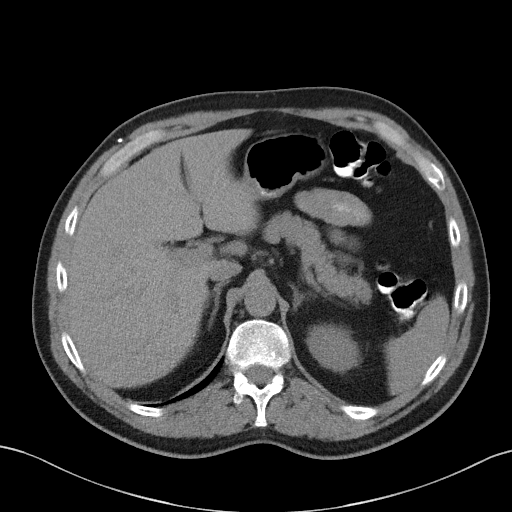
[im 81/92  soft-tissue]
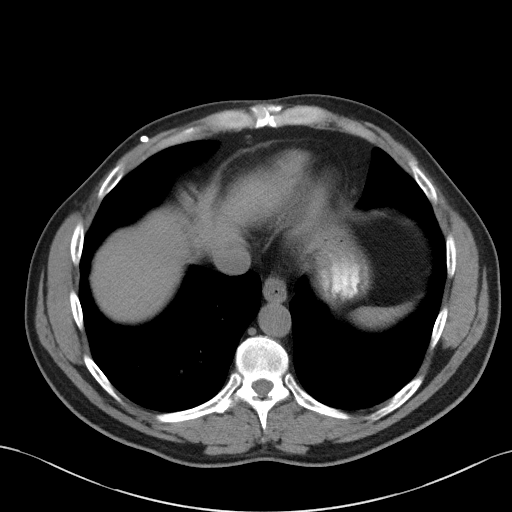
[im 86/92  soft-tissue]
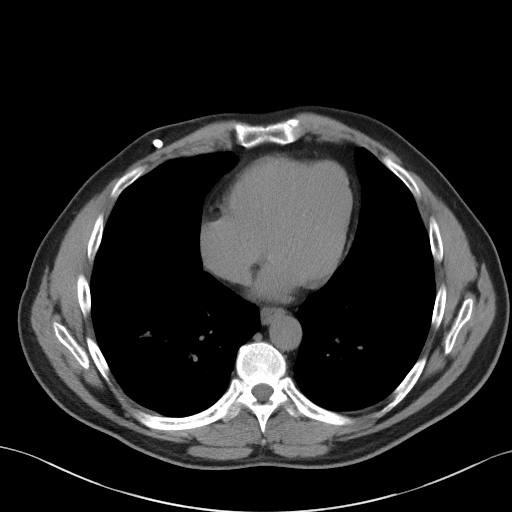

[Series 5: coronal st · coronal · 0.78mm/px · 3 of 99 slices shown]
[im 33/99  soft-tissue]
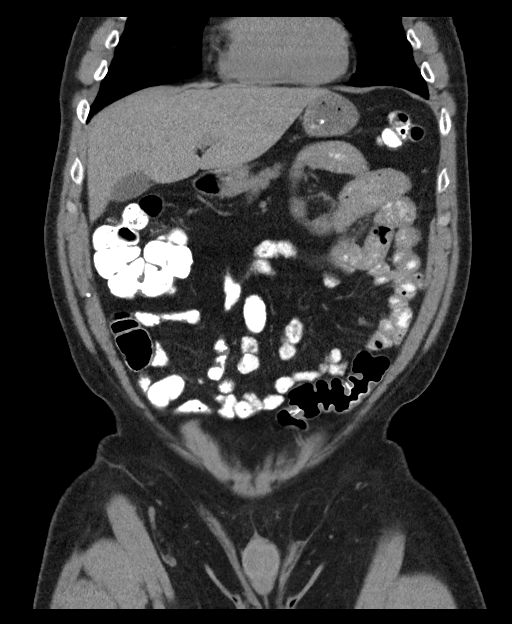
[im 44/99  soft-tissue]
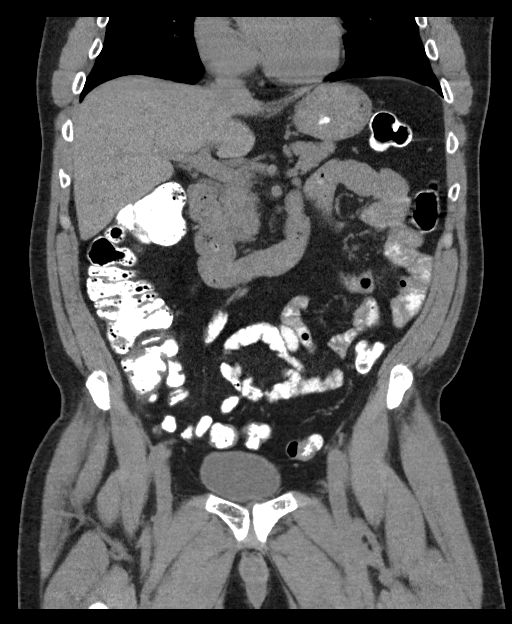
[im 55/99  soft-tissue]
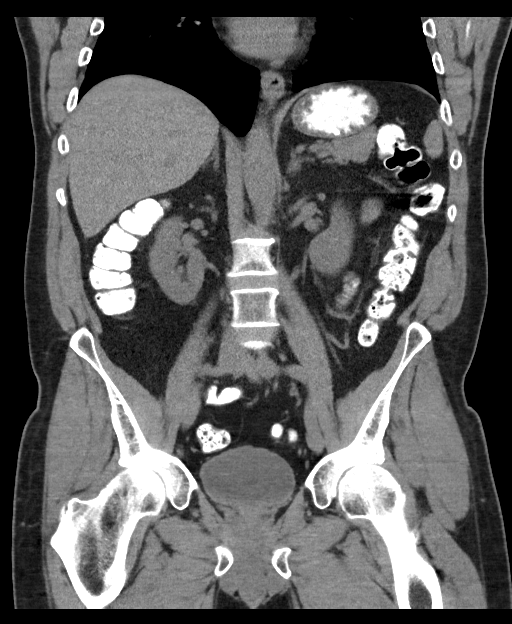

[16 of 46 positions shown; findings below may reference images not displayed]

FINDINGS: Lower chest: A VP shunt catheter is noted coursing along the
patient's anterior right chest. The lung bases are clear.

Hepatobiliary: No focal liver abnormality is seen. No gallstones,
gallbladder wall thickening, or biliary dilatation.

Pancreas: Unremarkable. No pancreatic ductal dilatation or
surrounding inflammatory changes.

Spleen: Normal in size without focal abnormality.

Adrenals/Urinary Tract: The right kidney is unremarkable. There is a
5-6 mm stone at the left UVJ without significant left-sided
collecting system dilatation. There are no residual radiopaque
stones identified in the left kidney. The bladder is unremarkable.

Stomach/Bowel: Stomach is within normal limits. Appendix appears
normal. No evidence of bowel wall thickening, distention, or
inflammatory changes. The patient's VP shunt catheter terminates in
the right lower quadrant.

Vascular/Lymphatic: Aortic atherosclerosis. No enlarged abdominal or
pelvic lymph nodes.

Reproductive: Prostate gland is mildly enlarged.

Other: There is a fat containing left inguinal hernia.

Musculoskeletal: There is a bilateral pars defect at L5 resulting in
grade 2-3 anterolisthesis of L5 on S1. There is severe disc height
loss at the L5-S1 level. There is no evidence of displaced fracture.
IMPRESSION: 1. There is a 5-6 mm stone at the left UVJ without left-sided
hydronephrosis. No residual radiopaque stones identified in either
kidney.
2. Bilateral pars defect at L5 resulting in significant grade 2-3
anterolisthesis of L5 on S1

Aortic Atherosclerosis (USXTY-394.4).

## 2021-05-25 DIAGNOSIS — K219 Gastro-esophageal reflux disease without esophagitis: Secondary | ICD-10-CM | POA: Diagnosis not present

## 2021-05-25 DIAGNOSIS — Z125 Encounter for screening for malignant neoplasm of prostate: Secondary | ICD-10-CM | POA: Diagnosis not present

## 2021-05-25 DIAGNOSIS — E785 Hyperlipidemia, unspecified: Secondary | ICD-10-CM | POA: Diagnosis not present

## 2021-05-25 DIAGNOSIS — Z79899 Other long term (current) drug therapy: Secondary | ICD-10-CM | POA: Diagnosis not present

## 2021-06-02 DIAGNOSIS — Z23 Encounter for immunization: Secondary | ICD-10-CM | POA: Diagnosis not present

## 2021-06-02 DIAGNOSIS — K219 Gastro-esophageal reflux disease without esophagitis: Secondary | ICD-10-CM | POA: Diagnosis not present

## 2021-06-02 DIAGNOSIS — Z6830 Body mass index (BMI) 30.0-30.9, adult: Secondary | ICD-10-CM | POA: Diagnosis not present

## 2021-06-02 DIAGNOSIS — Z0000 Encounter for general adult medical examination without abnormal findings: Secondary | ICD-10-CM | POA: Diagnosis not present

## 2021-06-02 DIAGNOSIS — N200 Calculus of kidney: Secondary | ICD-10-CM | POA: Diagnosis not present

## 2021-06-02 DIAGNOSIS — L509 Urticaria, unspecified: Secondary | ICD-10-CM | POA: Diagnosis not present

## 2021-08-07 DIAGNOSIS — Z1211 Encounter for screening for malignant neoplasm of colon: Secondary | ICD-10-CM | POA: Diagnosis not present

## 2021-08-07 DIAGNOSIS — D499 Neoplasm of unspecified behavior of unspecified site: Secondary | ICD-10-CM | POA: Diagnosis not present

## 2021-08-07 DIAGNOSIS — K635 Polyp of colon: Secondary | ICD-10-CM | POA: Diagnosis not present

## 2021-08-07 DIAGNOSIS — D122 Benign neoplasm of ascending colon: Secondary | ICD-10-CM | POA: Diagnosis not present

## 2022-03-12 ENCOUNTER — Ambulatory Visit
Admission: EM | Admit: 2022-03-12 | Discharge: 2022-03-12 | Disposition: A | Discharge status: Home / Self Care | Payer: BC Managed Care – PPO

## 2022-03-12 DIAGNOSIS — T7840XA Allergy, unspecified, initial encounter: Secondary | ICD-10-CM | POA: Diagnosis not present

## 2022-03-12 DIAGNOSIS — L03114 Cellulitis of left upper limb: Secondary | ICD-10-CM | POA: Diagnosis not present

## 2022-03-12 MED ORDER — METHYLPREDNISOLONE SODIUM SUCC 125 MG IJ SOLR
80.0000 mg | Freq: Once | INTRAMUSCULAR | Status: AC
  Administered 2022-03-12: 80 mg via INTRAMUSCULAR

## 2022-03-12 MED ORDER — DOXYCYCLINE HYCLATE 100 MG PO CAPS: 100.0000 mg | ORAL_CAPSULE | Freq: Two times a day (BID) | ORAL | 0 refills | Status: AC

## 2022-03-12 MED ORDER — PREDNISONE 20 MG PO TABS: 20.0000 mg | ORAL_TABLET | Freq: Two times a day (BID) | ORAL | 0 refills | Status: AC

## 2022-03-12 MED ORDER — TRIAMCINOLONE ACETONIDE 0.1 % EX CREA: 1.0000 | TOPICAL_CREAM | Freq: Two times a day (BID) | CUTANEOUS | 0 refills | Status: AC

## 2022-03-12 NOTE — Discharge Instructions (Addendum)
It is unclear why you are having allergic reaction on your arms and legs.  Continue to consider the cause.  Use antihistamine such as Zyrtec or Claritin add Benadryl if still needed for itching the Benadryl will only last 4 to 6 hours and may cause drowsiness.  Steroid for 5 days.  You were given a shot of steroids Solu-Medrol 80 mg today take antibiotic for early signs of infection on the left arm.  Return if not improving over the next 2 to 3 days sooner if worse or new symptoms such as shortness of breath.

## 2022-03-12 NOTE — ED Provider Notes (Signed)
[facility]   MRN: [id number] DOB: [birth year]  Subjective:   Chief Complaint;  Chief Complaint  Patient presents with   Rash   Arm Problem  Pt reports rash in arms x 1 week; arms started itching and swelling 2 days ago. Denies new medications, food, detergents, insect bites. Clobetasol cream (leftover) give some relief with the rash  Glenn Ramos is a 61 y.o. male presenting for rash on arms and bilateral lower ankles for the last week with itching and swelling over the last 2 days.  Patient denies any known causes.  He has used some topical steroid with some relief however rash persists and now includes localized swelling particularly of the arms.  He denies shortness of breath, nausea vomiting or fever.  No current facility-administered medications for this encounter.  Current Outpatient Medications:    doxycycline (VIBRAMYCIN) 100 MG capsule, Take 1 capsule (100 mg total) by mouth 2 (two) times daily for 7 days., Disp: 14 capsule, Rfl: 0   magnesium 30 MG tablet, Take 30 mg by mouth 2 (two) times daily., Disp: , Rfl:    predniSONE (DELTASONE) 20 MG tablet, Take 1 tablet (20 mg total) by mouth 2 (two) times daily with a meal for 5 days., Disp: 10 tablet, Rfl: 0   triamcinolone cream (KENALOG) 0.1 %, Apply 1 Application topically 2 (two) times daily., Disp: 30 g, Rfl: 0   cetirizine (ZYRTEC ALLERGY) 10 MG tablet, Take 1 tablet (10 mg total) by mouth daily., Disp: 30 tablet, Rfl: 0   HYDROcodone-acetaminophen (NORCO/VICODIN) 5-325 MG tablet, Take 1 tablet by mouth every 6 (six) hours as needed for moderate pain., Disp: 6 tablet, Rfl: 0   No Known Allergies  Past Medical History:  Diagnosis Date   Meningitis spinal      Review of Systems  All other systems reviewed and are negative.    Objective:   Vitals: BP 103/71 (BP Location: Right Arm)   Pulse 72   Temp 98.6 F (37 C) (Oral)   Resp 18   SpO2 95%   Physical Exam Vitals and nursing note  reviewed.  Constitutional:      General: He is not in acute distress.    Appearance: Normal appearance. He is normal weight. He is not ill-appearing or toxic-appearing.  HENT:     Head: Normocephalic and atraumatic.     Right Ear: External ear normal.     Left Ear: External ear normal.  Eyes:     Conjunctiva/sclera: Conjunctivae normal.  Pulmonary:     Effort: Pulmonary effort is normal. No respiratory distress.  Skin:    Findings: Rash present.     Comments: Spreading irregular border but not hypertrophic erythematous rash to bilateral arms particularly dorsal aspect with localized swelling likely from secondary scratching, no open lesions, urticarial in presentation.  No secondary signs of infestation.  Area on the left upper arm near bicep area with moderate swelling and some hot red region 5 cm in size no fluctuance but concerning for early cellulitis.  No neurovascular deficit distal extremities  Similar erythematous rash with petechial presentation from excessive itching to left medial aspect of ankle, no rash on the right foot.  No NVD D or gross swelling no open wounds  Minimal erythema dorsal aspect of right foot  Neurological:     Mental Status: He is alert.  Psychiatric:        Mood and Affect: Mood normal.  Behavior: Behavior normal.            Assessment and Plan :   1. Allergic reaction, initial encounter   2. Cellulitis of left upper extremity     Meds ordered this encounter  Medications   methylPREDNISolone sodium succinate (SOLU-MEDROL) 125 mg/2 mL injection 80 mg   doxycycline (VIBRAMYCIN) 100 MG capsule    Sig: Take 1 capsule (100 mg total) by mouth 2 (two) times daily for 7 days.    Dispense:  14 capsule    Refill:  0    Order Specific Question:   Supervising Provider    Answer:   Merrilee Jansky [[id number]]   predniSONE (DELTASONE) 20 MG tablet    Sig: Take 1 tablet (20 mg total) by mouth 2 (two) times daily with a meal for 5 days.     Dispense:  10 tablet    Refill:  0    Order Specific Question:   Supervising Provider    Answer:   Merrilee Jansky [id number]   triamcinolone cream (KENALOG) 0.1 %    Sig: Apply 1 Application topically 2 (two) times daily.    Dispense:  30 g    Refill:  0    Order Specific Question:   Supervising Provider    Answer:   Merrilee Jansky [[id number]]    MDM:  Glenn Ramos is a 61 y.o. male presenting for ongoing rash of extremities upper and lower for the last week.  On exam there is confluent urticarial appearing rash bilateral upper and lower extremities to a much lesser extent top of right foot.  Patient has no respiratory distress and appears in no acute pain, nontoxic-appearing.  Patient's history and physical are consistent with an allergic dermatitis.  He is given a Solu-Medrol 80 mg IM in the clinic and sent home with prednisone 20 mg twice daily for 5 days and doxycycline for 7 days for concerning area of confluent erythema left biceps region.  Patient is encouraged to not scratch, add antihistamine and use cool packs.  He will return if not improving over the next couple of days sooner if worse.  I discussed todays findings, treatment plan, follow up and return instructions. Questions were answered. Patient/representative stated understanding of the instructions and patient is stable for discharge.  Dewaine Conger FNP-C MSN     Jone Baseman, NP 03/12/22 1034

## 2022-03-12 NOTE — ED Triage Notes (Signed)
Pt reports rash in arms x 1 week; arms started itching and swelling 2 days ago. Denies new medications, food, detergents, insect bites. Clobetasol cream (leftover) give some relief with the rash.

## 2022-05-25 DIAGNOSIS — Z125 Encounter for screening for malignant neoplasm of prostate: Secondary | ICD-10-CM | POA: Diagnosis not present

## 2022-05-25 DIAGNOSIS — K219 Gastro-esophageal reflux disease without esophagitis: Secondary | ICD-10-CM | POA: Diagnosis not present

## 2022-05-25 DIAGNOSIS — E785 Hyperlipidemia, unspecified: Secondary | ICD-10-CM | POA: Diagnosis not present

## 2022-05-25 DIAGNOSIS — N200 Calculus of kidney: Secondary | ICD-10-CM | POA: Diagnosis not present

## 2022-06-03 DIAGNOSIS — Z23 Encounter for immunization: Secondary | ICD-10-CM | POA: Diagnosis not present

## 2022-07-16 ENCOUNTER — Encounter: Payer: Self-pay | Admitting: Allergy & Immunology

## 2022-07-16 ENCOUNTER — Ambulatory Visit: Payer: BC Managed Care – PPO | Admitting: Allergy & Immunology

## 2022-07-16 ENCOUNTER — Other Ambulatory Visit: Payer: Self-pay

## 2022-07-16 VITALS — BP 122/72 | HR 80 | Temp 97.9°F | Resp 20 | Ht 67.0 in | Wt 191.0 lb

## 2022-07-16 DIAGNOSIS — J3089 Other allergic rhinitis: Secondary | ICD-10-CM | POA: Diagnosis not present

## 2022-07-16 DIAGNOSIS — L500 Allergic urticaria: Secondary | ICD-10-CM | POA: Diagnosis not present

## 2022-07-16 MED ORDER — CETIRIZINE HCL 10 MG PO TABS: 20.0000 mg | ORAL_TABLET | Freq: Every day | ORAL | 1 refills | Status: AC

## 2022-07-16 MED ORDER — FAMOTIDINE 20 MG PO TABS: 20.0000 mg | ORAL_TABLET | Freq: Two times a day (BID) | ORAL | 1 refills | Status: AC

## 2022-07-16 MED ORDER — FEXOFENADINE HCL 180 MG PO TABS: 360.0000 mg | ORAL_TABLET | Freq: Every morning | ORAL | 1 refills | Status: AC

## 2022-07-16 NOTE — Progress Notes (Signed)
NEW PATIENT  Date of Service/Encounter:  07/16/22  Consult requested by: Asencion Noble, MD   Assessment:   Allergic urticaria  Seasonal and perennial allergic rhinitis (grasses, ragweed, trees, indoor molds, outdoor molds, and dust mites)  Plan/Recommendations:   1. Allergic urticaria - You did have some positives on testing but I am not sure that this explains your symptoms. - Food testing was only slightly reactive to peanuts, but I do not think that this is large enough to worry about. - Your history does not have any "red flags" such as fevers, joint pains, or permanent skin changes that would be concerning for a more serious cause of hives.  - We will get some labs to rule out serious causes of hives: alpha gal panel, complete blood count, tryptase level, chronic urticaria panel, CMP, ESR, and CRP. - Chronic hives are often times a self limited process and will "burn themselves out" over 6-12 months, although this is not always the case.  - In the meantime, start suppressive dosing of antihistamines:   - Morning: Allegra (fexofenadine) 360mg  (two tablets) + Pepcid (famotidine) 20mg   - Evening: Zyrtec (cetirizine) 20mg  (two tablets) + Pepcid (famotidine) 20mg  - You can change this dosing at home, decreasing the dose as needed or increasing the dosing as needed.  - If you are not tolerating the medications or are tired of taking them every day, we can start treatment with a monthly injectable medication called Xolair.   2. Seasonal and perennial allergic rhinitis - Testing today showed: grasses, ragweed, trees, indoor molds, outdoor molds, and dust mites - Copy of test results provided.  - Avoidance measures provided. - Start taking: antihistamines as above - You can use an extra dose of the antihistamine, if needed, for breakthrough symptoms.  - Consider nasal saline rinses 1-2 times daily to remove allergens from the nasal cavities as well as help with mucous clearance (this is  especially helpful to do before the nasal sprays are given)  3. Return in about 2 months (around 09/14/2022).    This note in its entirety was forwarded to the Provider who requested this consultation.  Subjective:   Glenn Ramos is a 62 y.o. male presenting today for evaluation of  Chief Complaint  Patient presents with   Urticaria    Itchy rash started back in the summer on arms ,wrist and ankles.    Glenn Ramos has a history of the following: Patient Active Problem List   Diagnosis Date Noted   Allergic urticaria 07/16/2022   Seasonal and perennial allergic rhinitis 07/16/2022    History obtained from: chart review and patient.  Glenn Ramos was referred by Asencion Noble, MD.     Glenn Ramos is a 62 y.o. male presenting for an evaluation of urticaria .  He reports that he has had urticaria since the summer 2023.  It started on his arms and then included his wrist as well as his ankles and legs.  He was gardening when it first started.  He was taking green beans and thought he had reacted to decreasing the themselves.  He eats that just fine without a problem.  Otherwise, there has been no food association.  He has tried Benadryl which helps transiently.  Zyrtec was intermittent.  He has only ever done Zyrtec once a day.  He has not tried doubling that.  In September 2023, he received a steroid injection as well as a 5-day prednisone burst which cleared up his skin  very effectively.  He has not been to the emergency room.  He does not have any other symptoms including shortness of breath, throat swelling, coughing, vomiting, or diarrhea.  The longest time that he is gone without hives is approximately 1 week.  There is no change in the frequency of the lines with location.  Winter has been better form in general.  He has no fevers behind his and it does the regular skin when he clears up after 6 to 12 hours. He has never had hives previously.  Allergic Rhinitis Symptom History: He  does have sinus infections but very rarely.  He gets close to 1 sinus infection per year.  He was allergy tested years ago but he does not remember the results.  He was never on allergen immunotherapy.  He does not use any nose sprays.  Otherwise, there is no history of other atopic diseases, including food allergies, drug allergies, stinging insect allergies, or contact dermatitis. There is no significant infectious history. Vaccinations are up to date.    Past Medical History: Patient Active Problem List   Diagnosis Date Noted   Allergic urticaria 07/16/2022   Seasonal and perennial allergic rhinitis 07/16/2022    Medication List:  Allergies as of 07/16/2022   No Known Allergies      Medication List        Accurate as of July 16, 2022  1:11 PM. If you have any questions, ask your nurse or doctor.          cetirizine 10 MG tablet Commonly known as: ZyrTEC Allergy Take 1 tablet (10 mg total) by mouth daily.   HYDROcodone-acetaminophen 5-325 MG tablet Commonly known as: NORCO/VICODIN Take 1 tablet by mouth every 6 (six) hours as needed for moderate pain.   magnesium 30 MG tablet Take 30 mg by mouth 2 (two) times daily.   triamcinolone cream 0.1 % Commonly known as: KENALOG Apply 1 Application topically 2 (two) times daily.        Birth History: non-contributory  Developmental History: non-contributory  Past Surgical History: Past Surgical History:  Procedure Laterality Date   BRAIN SURGERY     SINOSCOPY     SPINE SURGERY       Family History: History reviewed. No pertinent family history.   Social History: Claud lives at home with his family.  He lives in a house that is 60 years old.  There is 1 in the main living areas as well as the bedroom.  There is no water damage in the home.  They have gas heating and central cooling.  There is 1 cat inside of the home.  There are dust mite covers on the bedding.  There is no tobacco exposure.  He is exposed to  fumes, chemicals, and dust in his job.  He works in the [employer] for [employer].  He has done this for over 20 years.   Review of Systems  Constitutional: Negative.  Negative for chills, fever, malaise/fatigue and weight loss.  HENT: Negative.  Negative for congestion, ear discharge and ear pain.   Eyes:  Negative for pain, discharge and redness.  Respiratory:  Negative for cough, sputum production, shortness of breath and wheezing.   Cardiovascular: Negative.  Negative for chest pain and palpitations.  Gastrointestinal:  Negative for abdominal pain, heartburn, nausea and vomiting.  Skin:  Positive for itching and rash.  Neurological:  Negative for dizziness and headaches.  Endo/Heme/Allergies:  Negative for environmental allergies. Does not bruise/bleed easily.  Objective:   Blood pressure 122/72, pulse 80, temperature 97.9 F (36.6 C), resp. rate 20, height 5\' 7"  (1.702 m), weight 191 lb (86.6 kg), SpO2 96 %. Body mass index is 29.91 kg/m.     Physical Exam Vitals reviewed.  Constitutional:      Appearance: He is well-developed.     Comments: Pleasant.   HENT:     Head: Normocephalic and atraumatic.     Right Ear: Tympanic membrane, ear canal and external ear normal. No drainage, swelling or tenderness. Tympanic membrane is not injected, scarred, erythematous, retracted or bulging.     Left Ear: Tympanic membrane, ear canal and external ear normal. No drainage, swelling or tenderness. Tympanic membrane is not injected, scarred, erythematous, retracted or bulging.     Nose: No nasal deformity, septal deviation, mucosal edema or rhinorrhea.     Right Turbinates: Enlarged, swollen and pale.     Left Turbinates: Enlarged, swollen and pale.     Right Sinus: No maxillary sinus tenderness or frontal sinus tenderness.     Left Sinus: No maxillary sinus tenderness or frontal sinus tenderness.     Mouth/Throat:     Mouth: Mucous membranes are not pale and not dry.     Pharynx:  Uvula midline.  Eyes:     General:        Right eye: No discharge.        Left eye: No discharge.     Conjunctiva/sclera: Conjunctivae normal.     Right eye: Right conjunctiva is not injected. No chemosis.    Left eye: Left conjunctiva is not injected. No chemosis.    Pupils: Pupils are equal, round, and reactive to light.  Cardiovascular:     Rate and Rhythm: Normal rate and regular rhythm.     Heart sounds: Normal heart sounds.  Pulmonary:     Effort: Pulmonary effort is normal. No tachypnea, accessory muscle usage or respiratory distress.     Breath sounds: Normal breath sounds. No wheezing, rhonchi or rales.  Chest:     Chest wall: No tenderness.  Abdominal:     Tenderness: There is no abdominal tenderness. There is no guarding or rebound.  Lymphadenopathy:     Head:     Right side of head: No submandibular, tonsillar or occipital adenopathy.     Left side of head: No submandibular, tonsillar or occipital adenopathy.     Cervical: No cervical adenopathy.  Skin:    General: Skin is warm.     Capillary Refill: Capillary refill takes less than 2 seconds.     Coloration: Skin is not pale.     Findings: Erythema and rash present. No abrasion or petechiae. Rash is urticarial. Rash is not papular or vesicular.  Neurological:     Mental Status: He is alert.  Psychiatric:        Behavior: Behavior is cooperative.      Diagnostic studies:   Allergy Studies:     Airborne Adult Perc - 07/16/22 0918     Time Antigen Placed 0919    Allergen Manufacturer Lavella Hammock    Location Back    Number of Test 59    1. Control-Buffer 50% Glycerol Negative    2. Control-Histamine 1 mg/ml 2+    3. Albumin saline Negative    4. [place] 2+    5. [place] Negative    6. Johnson Negative    7. [place] Blue Negative    8. Meadow Fescue Negative    9.  Perennial Rye Negative    10. Sweet Vernal Negative    11. Timothy Negative    12. Cocklebur Negative    13. Burweed Marshelder Negative    14.  Ragweed, short 2+    15. Ragweed, Giant Negative    16. Plantain,  English Negative    17. Lamb's Quarters Negative    18. Sheep Sorrell Negative    19. Rough Pigweed Negative    20. Marsh Elder, Rough Negative    21. Mugwort, Common Negative    22. Ash mix Negative    23. Birch mix Negative    24. Beech American Negative    25. Box, Elder Negative    26. Cedar, red 2+    27. Cottonwood, [place] Negative    28. Elm mix Negative    29. Hickory Negative    30. Maple mix Negative    31. Oak, [place] mix Negative    32. Pecan Pollen Negative    33. Pine mix Negative    34. Sycamore Eastern Negative    35. Walnut, Black Pollen Negative    36. Alternaria alternata Negative    37. Cladosporium Herbarum Negative    38. Aspergillus mix 2+    39. Penicillium mix Negative    40. Bipolaris sorokiniana (Helminthosporium) Negative    41. Drechslera spicifera (Curvularia) 2+    42. Mucor plumbeus 2+    43. Fusarium moniliforme 2+    44. Aureobasidium pullulans (pullulara) Negative    45. Rhizopus oryzae Negative    46. Botrytis cinera Negative    47. Epicoccum nigrum Negative    48. Phoma betae Negative    49. Candida Albicans Negative    50. Trichophyton mentagrophytes Negative    51. Mite, D Farinae  5,000 AU/ml Negative    52. Mite, D Pteronyssinus  5,000 AU/ml 2+    53. Cat Hair 10,000 BAU/ml Negative    54.  Dog Epithelia Negative    55. Mixed Feathers Negative    56. Horse Epithelia Negative    57. Cockroach, German Negative    58. Mouse Negative    59. Tobacco Leaf Negative             Food Perc - 07/16/22 0919       Test Information   Time Antigen Placed 0919    Allergen Manufacturer Waynette Buttery    Location Back    Number of allergen test 10      Food   1. Peanut Negative    2. Soybean food Negative    3. Wheat, whole Negative    4. Sesame Negative    5. Milk, cow Negative    6. Egg White, chicken Negative    7. Casein Negative    8. Shellfish mix Negative     9. Fish mix Negative    10. Cashew Negative             Allergy testing results were read and interpreted by myself, documented by clinical staff.         Malachi Bonds, MD [facility]

## 2022-07-16 NOTE — Patient Instructions (Addendum)
1. Allergic urticaria - You did have some positives on testing but I am not sure that this explains your symptoms. - Food testing was only slightly reactive to peanuts, but I do not think that this is large enough to worry about. - Your history does not have any "red flags" such as fevers, joint pains, or permanent skin changes that would be concerning for a more serious cause of hives.  - We will get some labs to rule out serious causes of hives: alpha gal panel, complete blood count, tryptase level, chronic urticaria panel, CMP, ESR, and CRP. - Chronic hives are often times a self limited process and will "burn themselves out" over 6-12 months, although this is not always the case.  - In the meantime, start suppressive dosing of antihistamines:   - Morning: Allegra (fexofenadine) 360mg  (two tablets) + Pepcid (famotidine) 20mg   - Evening: Zyrtec (cetirizine) 20mg  (two tablets) + Pepcid (famotidine) 20mg  - You can change this dosing at home, decreasing the dose as needed or increasing the dosing as needed.  - If you are not tolerating the medications or are tired of taking them every day, we can start treatment with a monthly injectable medication called Xolair.   2. Seasonal and perennial allergic rhinitis - Testing today showed: grasses, ragweed, trees, indoor molds, outdoor molds, and dust mites. - Copy of test results provided.  - Avoidance measures provided. - Start taking: antihistamines as above - You can use an extra dose of the antihistamine, if needed, for breakthrough symptoms.  - Consider nasal saline rinses 1-2 times daily to remove allergens from the nasal cavities as well as help with mucous clearance (this is especially helpful to do before the nasal sprays are given)  3. Return in about 2 months (around 09/14/2022).    Please inform of any Emergency Department visits, hospitalizations, or changes in symptoms. Call before going to the ED for breathing or allergy symptoms  since we might be able to fit you in for a sick visit. Feel free to contact anytime with any questions, problems, or concerns.  It was a pleasure to meet you today!  Websites that have reliable patient information: 1. American Academy of Asthma, Allergy, and Immunology: www.aaaai.org 2. Food Allergy Research and Education (FARE): foodallergy.org 3. Mothers of Asthmatics: http://www.asthmacommunitynetwork.org 4. American College of Allergy, Asthma, and Immunology: www.acaai.org   COVID-19 Vaccine Information can be found at: 11/14/2022 For questions related to vaccine distribution or appointments, please email [email or web address] or call [phone].   We realize that you might be concerned about having an allergic reaction to the COVID19 vaccines. To help with that concern, WE ARE OFFERING THE COVID19 VACCINES IN OUR OFFICE! Ask the front desk for dates!     "Like" [place] on Facebook and Instagram for our latest updates!      A healthy democracy works best when [place] participate! Make sure you are registered to vote! If you have moved or changed any of your contact information, you will need to get this updated before voting!  In some cases, you MAY be able to register to vote online: [email or web address]      Airborne Adult Perc - 07/16/22 0918     Time Antigen Placed 0919    Allergen Manufacturer [place]    Location Back    Number of Test 59    1. Control-Buffer 50% Glycerol Negative    2. Control-Histamine 1 mg/ml 2+    3. Albumin saline Negative  4. [place] 2+    5. [place] Negative    6. Johnson Negative    7. [place] Blue Negative    8. Meadow Fescue Negative    9. Perennial Rye Negative    10. Sweet Vernal Negative    11. Timothy Negative    12. Cocklebur Negative    13. Burweed Marshelder Negative    14. Ragweed, short 2+    15. Ragweed, Giant Negative    16.  Plantain,  English Negative    17. Lamb's Quarters Negative    18. Sheep Sorrell Negative    19. Rough Pigweed Negative    20. Marsh Elder, Rough Negative    21. Mugwort, Common Negative    22. Ash mix Negative    23. Birch mix Negative    24. Beech American Negative    25. Box, Elder Negative    26. Cedar, red 2+    27. Cottonwood, [place] Negative    28. Elm mix Negative    29. Hickory Negative    30. Maple mix Negative    31. Oak, [place] mix Negative    32. Pecan Pollen Negative    33. Pine mix Negative    34. Sycamore Eastern Negative    35. [place], Black Pollen Negative    36. Alternaria alternata Negative    37. Cladosporium Herbarum Negative    38. Aspergillus mix 2+    39. Penicillium mix Negative    40. Bipolaris sorokiniana (Helminthosporium) Negative    41. Drechslera spicifera (Curvularia) 2+    42. Mucor plumbeus 2+    43. Fusarium moniliforme 2+    44. Aureobasidium pullulans (pullulara) Negative    45. Rhizopus oryzae Negative    46. Botrytis cinera Negative    47. Epicoccum nigrum Negative    48. Phoma betae Negative    49. Candida Albicans Negative    50. Trichophyton mentagrophytes Negative    51. Mite, D Farinae  5,000 AU/ml Negative    52. Mite, D Pteronyssinus  5,000 AU/ml 2+    53. Cat Hair 10,000 BAU/ml Negative    54.  Dog Epithelia Negative    55. Mixed Feathers Negative    56. Horse Epithelia Negative    57. Cockroach, German Negative    58. Mouse Negative    59. Tobacco Leaf Negative             Food Perc - 07/16/22 0919       Test Information   Time Antigen Placed 0919    Allergen Manufacturer Lavella Hammock    Location Back    Number of allergen test 10      Food   1. Peanut Negative    2. Soybean food Negative    3. Wheat, whole Negative    4. Sesame Negative    5. Milk, cow Negative    6. Egg White, chicken Negative    7. Casein Negative    8. Shellfish mix Negative    9. Fish mix Negative    10. Cashew Negative              Reducing Pollen Exposure  The American Academy of Allergy, Asthma and Immunology suggests the following steps to reduce your exposure to pollen during allergy seasons.    Do not hang sheets or clothing out to dry; pollen may collect on these items. Do not mow lawns or spend time around freshly cut grass; mowing stirs up pollen. Keep windows closed at night.  Keep car windows closed while driving. Minimize morning activities outdoors, a time when pollen counts are usually at their highest. Stay indoors as much as possible when pollen counts or humidity is high and on windy days when pollen tends to remain in the air longer. Use air conditioning when possible.  Many air conditioners have filters that trap the pollen spores. Use a HEPA room air filter to remove pollen form the indoor air you breathe.  Control of Mold Allergen   Mold and fungi can grow on a variety of surfaces provided certain temperature and moisture conditions exist.  Outdoor molds grow on plants, decaying vegetation and soil.  The major outdoor mold, Alternaria and Cladosporium, are found in very high numbers during hot and dry conditions.  Generally, a late Summer - Fall peak is seen for common outdoor fungal spores.  Rain will temporarily lower outdoor mold spore count, but counts rise rapidly when the rainy period ends.  The most important indoor molds are Aspergillus and Penicillium.  Dark, humid and poorly ventilated basements are ideal sites for mold growth.  The next most common sites of mold growth are the bathroom and the kitchen.  Outdoor (Seasonal) Mold Control  Positive outdoor molds via skin testing: Drechslera (Curvalaria) and Mucor  Use air conditioning and keep windows closed Avoid exposure to decaying vegetation. Avoid leaf raking. Avoid grain handling. Consider wearing a face mask if working in moldy areas.   Indoor (Perennial) Mold Control   Positive indoor molds via skin testing: Aspergillus and  Fusarium  Maintain humidity below 50%. Clean washable surfaces with 5% bleach solution. Remove sources e.g. contaminated carpets.    Control of Dust Mite Allergen    Dust mites play a major role in allergic asthma and rhinitis.  They occur in environments with high humidity wherever human skin is found.  Dust mites absorb humidity from the atmosphere (ie, they do not drink) and feed on organic matter (including shed human and animal skin).  Dust mites are a microscopic type of insect that you cannot see with the naked eye.  High levels of dust mites have been detected from mattresses, pillows, carpets, upholstered furniture, bed covers, clothes, soft toys and any woven material.  The principal allergen of the dust mite is found in its feces.  A gram of dust may contain 1,000 mites and 250,000 fecal particles.  Mite antigen is easily measured in the air during house cleaning activities.  Dust mites do not bite and do not cause harm to humans, other than by triggering allergies/asthma.    Ways to decrease your exposure to dust mites in your home:  Encase mattresses, box springs and pillows with a mite-impermeable barrier or cover   Wash sheets, blankets and drapes weekly in hot water (130 F) with detergent and dry them in a dryer on the hot setting.  Have the room cleaned frequently with a vacuum cleaner and a damp dust-mop.  For carpeting or rugs, vacuuming with a vacuum cleaner equipped with a high-efficiency particulate air (HEPA) filter.  The dust mite allergic individual should not be in a room which is being cleaned and should wait 1 hour after cleaning before going into the room. Do not sleep on upholstered furniture (eg, couches).   If possible removing carpeting, upholstered furniture and drapery from the home is ideal.  Horizontal blinds should be eliminated in the rooms where the person spends the most time (bedroom, study, television room).  Washable vinyl, roller-type shades are  optimal. Remove all non-washable stuffed toys from the bedroom.  Wash stuffed toys weekly like sheets and blankets above.   Reduce indoor humidity to less than 50%.  Inexpensive humidity monitors can be purchased at most hardware stores.  Do not use a humidifier as can make the problem worse and are not recommended.

## 2022-07-23 DIAGNOSIS — L500 Allergic urticaria: Secondary | ICD-10-CM | POA: Diagnosis not present

## 2022-07-23 DIAGNOSIS — Z91018 Allergy to other foods: Secondary | ICD-10-CM | POA: Diagnosis not present

## 2022-07-23 DIAGNOSIS — J3089 Other allergic rhinitis: Secondary | ICD-10-CM | POA: Diagnosis not present

## 2022-07-26 LAB — ALPHA-GAL PANEL
Allergen Lamb IgE: 0.65 kU/L — AB
Beef IgE: 1.64 kU/L — AB
IgE (Immunoglobulin E), Serum: 389 IU/mL (ref 6–495)
O215-IgE Alpha-Gal: 16.9 kU/L — AB
Pork IgE: 0.62 kU/L — AB

## 2022-07-27 ENCOUNTER — Other Ambulatory Visit: Payer: Self-pay

## 2022-07-27 LAB — CMP14+EGFR
Bilirubin Total: 0.4 mg/dL (ref 0.0–1.2)
Calcium: 9.9 mg/dL (ref 8.7–10.2)
Total Protein: 7.1 g/dL (ref 6.0–8.5)
eGFR: 99 mL/min/{1.73_m2} (ref >59)

## 2022-07-27 MED ORDER — EPINEPHRINE 0.3 MG/0.3ML IJ SOAJ: 0.3000 mg | INTRAMUSCULAR | 1 refills | Status: AC | PRN

## 2022-07-31 LAB — CMP14+EGFR
ALT: 13 IU/L (ref 0–44)
AST: 21 IU/L (ref 0–40)
Albumin/Globulin Ratio: 2.1 (ref 1.2–2.2)
Albumin: 4.8 g/dL (ref 3.8–4.9)
Alkaline Phosphatase: 107 IU/L (ref 44–121)
BUN/Creatinine Ratio: 15 (ref 9–20)
BUN: 13 mg/dL (ref 6–24)
CO2: 23 mmol/L (ref 20–29)
Chloride: 104 mmol/L (ref 96–106)
Creatinine, Ser: 0.87 mg/dL (ref 0.76–1.27)
Globulin, Total: 2.3 g/dL (ref 1.5–4.5)
Glucose: 89 mg/dL (ref 70–99)
Potassium: 4.9 mmol/L (ref 3.5–5.2)
Sodium: 143 mmol/L (ref 134–144)

## 2022-07-31 LAB — CBC WITH DIFFERENTIAL
Basophils Absolute: 0 10*3/uL (ref 0.0–0.2)
Basos: 0 %
EOS (ABSOLUTE): 0.2 10*3/uL (ref 0.0–0.4)
Eos: 3 %
Hematocrit: 49.6 % (ref 37.5–51.0)
Hemoglobin: 16.7 g/dL (ref 13.0–17.7)
Immature Grans (Abs): 0 10*3/uL (ref 0.0–0.1)
Immature Granulocytes: 0 %
Lymphocytes Absolute: 1.3 10*3/uL (ref 0.7–3.1)
Lymphs: 19 %
MCH: 31 pg (ref 26.6–33.0)
MCHC: 33.7 g/dL (ref 31.5–35.7)
MCV: 92 fL (ref 79–97)
Monocytes Absolute: 0.3 10*3/uL (ref 0.1–0.9)
Monocytes: 5 %
Neutrophils Absolute: 5 10*3/uL (ref 1.4–7.0)
Neutrophils: 73 %
RBC: 5.38 x10E6/uL (ref 4.14–5.80)
RDW: 12.4 % (ref 11.6–15.4)
WBC: 6.7 10*3/uL (ref 3.4–10.8)

## 2022-07-31 LAB — CHRONIC URTICARIA: cu index: <8.1 (ref <10)

## 2022-07-31 LAB — TRYPTASE: Tryptase: 9.1 ug/L (ref 2.2–13.2)

## 2022-07-31 LAB — THYROID ANTIBODIES
Thyroglobulin Antibody: 10.9 IU/mL — ABNORMAL HIGH (ref 0.0–0.9)
Thyroperoxidase Ab SerPl-aCnc: 241 IU/mL — ABNORMAL HIGH (ref 0–34)

## 2022-07-31 LAB — ANTINUCLEAR ANTIBODIES, IFA: ANA Titer 1: NEGATIVE

## 2022-07-31 LAB — SEDIMENTATION RATE: Sed Rate: 7 mm/hr (ref 0–30)

## 2022-07-31 LAB — C-REACTIVE PROTEIN: CRP: 3 mg/L (ref 0–10)

## 2022-09-23 NOTE — Progress Notes (Signed)
[place], [place] [address] Dept: [phone]  FOLLOW UP NOTE  Patient ID: Glenn Ramos, male    DOB: 10-03-1960  Age: 62 y.o. MRN: [id number] Date of Office Visit: 09/24/2022  Assessment  Chief Complaint: Follow-up (Pt states that is doing good and improving.)  HPI Glenn Ramos is a 62 year old male who presents to the clinic for follow-up visit.  He was last seen in this clinic on 07/16/2022 by Dr. Ernst Bowler for evaluation of urticaria, allergic rhinitis, and alpha gal allergy.    At today's visit, he reports his allergic rhinitis has been moderately well-controlled with occasional sneezing as the main symptom.  He continues cetirizine daily and occasionally uses saline nasal rinses.His last environmental allergy testing was on 07/16/2022 and was positive to grass pollen, ragweed pollen, tree pollen, mold, and dust mite.    He continues to avoid mammalian meats with no accidental ingestion or EpiPen use since his last visit to this clinic.  He reports he is able to tolerate dairy products with no adverse reaction.  His last alpha gal panel was positive via lab test on 07/23/2022.  Epinephrine autoinjector set is up-to-date.  Urticaria is reported as moderately well-controlled with occasional breakouts.  He continues cetirizine 10 mg twice a day which keeps his symptoms well-controlled.  He reports that if he forgets a dose he is more likely to experience breakouts.  He denies concomitant cardiopulmonary or gastrointestinal symptoms with these hives.  He is overall satisfied with taking cetirizine to control hives and is not currently interested in Xolair.  He reports that cetirizine twice a day does not make him sleepy.  He occasionally takes famotidine for reflux control.  His current medications are listed in the chart.  Drug Allergies:  No Known Allergies   Physical Exam: BP 122/70   Pulse 61   Temp (!) 97.5 F (36.4 C)   Resp 20   Wt 188 lb (85.3  kg)   SpO2 97%   BMI 29.44 kg/m    Physical Exam Vitals reviewed.  Constitutional:      Appearance: Normal appearance.  HENT:     Head: Normocephalic and atraumatic.     Right Ear: Tympanic membrane normal.     Left Ear: Tympanic membrane normal.     Nose:     Comments: Bilateral nares normal.  Pharynx normal.  Ears normal.  Eyes normal.    Mouth/Throat:     Pharynx: Oropharynx is clear.  Eyes:     Conjunctiva/sclera: Conjunctivae normal.  Cardiovascular:     Rate and Rhythm: Normal rate and regular rhythm.     Heart sounds: Normal heart sounds. No murmur heard. Pulmonary:     Effort: Pulmonary effort is normal.     Breath sounds: Normal breath sounds.     Comments: Lungs clear to auscultation Musculoskeletal:        General: Normal range of motion.     Cervical back: Normal range of motion and neck supple.  Skin:    General: Skin is warm and dry.  Neurological:     Mental Status: He is alert and oriented to person, place, and time.  Psychiatric:        Mood and Affect: Mood normal.        Behavior: Behavior normal.        Thought Content: Thought content normal.        Judgment: Judgment normal.      Assessment and Plan: 1. Allergy  to alpha-gal   2. Allergic urticaria   3. Seasonal and perennial allergic rhinitis     Patient Instructions  Allergic rhinitis Continue allergen avoidance measures directed toward grass pollen, ragweed pollen, tree pollen, mold, and dust mite as listed below Continue an antihistamine once a day as needed for runny nose or itch Consider saline nasal rinses as needed for nasal symptoms. Use this before any medicated nasal sprays for best result  Alpha gal allergy Continue to avoid mammalian meats.  In case of an allergic reaction, take Benadryl 50 mg every 4 hours, and if life-threatening symptoms occur, inject with EpiPen 0.3 mg. We can retest your alpha gal level in about 1 year.  Continue to avoid tick bites  Hives  (urticaria) Use the least amount of medications while remaining hive free Allegra 180 (fexofenadine) 180 mg twice a day and famotidine (Pepcid) 20 mg twice a day. If no symptoms for 7-14 days then decrease to. Allegra (fexofenadine) twice 180 mg a day and famotidine (Pepcid) 20 mg once a day.  If no symptoms for 7-14 days then decrease to. Allegra (fexofenadine)180 mg  twice a day.  If no symptoms for 7-14 days then decrease to. Allegra (fexofenadine)180 mg once a day.  May use Benadryl (diphenhydramine) as needed for breakthrough hives       If symptoms return, then step up dosage Keep a detailed symptom journal including foods eaten, contact with allergens, medications taken, weather changes.    Call the clinic if this treatment plan is not working well for you  Follow up in 1 year or sooner if needed.   Return in about 1 year (around 09/24/2023), or if symptoms worsen or fail to improve.    Thank you for the opportunity to care for this patient.  Please do not hesitate to contact me with questions.  Gareth Morgan, FNP [facility]

## 2022-09-23 NOTE — Patient Instructions (Signed)
Allergic rhinitis Continue allergen avoidance measures directed toward grass pollen, ragweed pollen, tree pollen, mold, and dust mite as listed below Continue an antihistamine once a day as needed for runny nose or itch Consider saline nasal rinses as needed for nasal symptoms. Use this before any medicated nasal sprays for best result  Alpha gal allergy Continue to avoid mammalian meats.  In case of an allergic reaction, take Benadryl 50 mg every 4 hours, and if life-threatening symptoms occur, inject with EpiPen 0.3 mg. We can retest your alpha gal level in about 1 year.  Continue to avoid tick bites  Hives (urticaria) Use the least amount of medications while remaining hive free Allegra 180 (fexofenadine) 180 mg twice a day and famotidine (Pepcid) 20 mg twice a day. If no symptoms for 7-14 days then decrease to. Allegra (fexofenadine) twice 180 mg a day and famotidine (Pepcid) 20 mg once a day.  If no symptoms for 7-14 days then decrease to. Allegra (fexofenadine)180 mg  twice a day.  If no symptoms for 7-14 days then decrease to. Allegra (fexofenadine)180 mg once a day.  May use Benadryl (diphenhydramine) as needed for breakthrough hives       If symptoms return, then step up dosage Keep a detailed symptom journal including foods eaten, contact with allergens, medications taken, weather changes.    Call the clinic if this treatment plan is not working well for you  Follow up in 1 year or sooner if needed.  Reducing Pollen Exposure The American Academy of Allergy, Asthma and Immunology suggests the following steps to reduce your exposure to pollen during allergy seasons. Do not hang sheets or clothing out to dry; pollen may collect on these items. Do not mow lawns or spend time around freshly cut grass; mowing stirs up pollen. Keep windows closed at night.  Keep car windows closed while driving. Minimize morning activities outdoors, a time when pollen counts are usually at their  highest. Stay indoors as much as possible when pollen counts or humidity is high and on windy days when pollen tends to remain in the air longer. Use air conditioning when possible.  Many air conditioners have filters that trap the pollen spores. Use a HEPA room air filter to remove pollen form the indoor air you breathe.  Control of Mold Allergen Mold and fungi can grow on a variety of surfaces provided certain temperature and moisture conditions exist.  Outdoor molds grow on plants, decaying vegetation and soil.  The major outdoor mold, Alternaria and Cladosporium, are found in very high numbers during hot and dry conditions.  Generally, a late Summer - Fall peak is seen for common outdoor fungal spores.  Rain will temporarily lower outdoor mold spore count, but counts rise rapidly when the rainy period ends.  The most important indoor molds are Aspergillus and Penicillium.  Dark, humid and poorly ventilated basements are ideal sites for mold growth.  The next most common sites of mold growth are the bathroom and the kitchen.  Outdoor [employer] Use air conditioning and keep windows closed Avoid exposure to decaying vegetation. Avoid leaf raking. Avoid grain handling. Consider wearing a face mask if working in moldy areas.  Indoor Mold Control Maintain humidity below 50%. Clean washable surfaces with 5% bleach solution. Remove sources e.g. Contaminated carpets.   Control of Dust Mite Allergen Dust mites play a major role in allergic asthma and rhinitis. They occur in environments with high humidity wherever human skin is found. Dust mites absorb humidity  from the atmosphere (ie, they do not drink) and feed on organic matter (including shed human and animal skin). Dust mites are a microscopic type of insect that you cannot see with the naked eye. High levels of dust mites have been detected from mattresses, pillows, carpets, upholstered furniture, bed covers, clothes, soft toys and any woven  material. The principal allergen of the dust mite is found in its feces. A gram of dust may contain 1,000 mites and 250,000 fecal particles. Mite antigen is easily measured in the air during house cleaning activities. Dust mites do not bite and do not cause harm to humans, other than by triggering allergies/asthma.  Ways to decrease your exposure to dust mites in your home:  1. Encase mattresses, box springs and pillows with a mite-impermeable barrier or cover  2. Wash sheets, blankets and drapes weekly in hot water (130 F) with detergent and dry them in a dryer on the hot setting.  3. Have the room cleaned frequently with a vacuum cleaner and a damp dust-mop. For carpeting or rugs, vacuuming with a vacuum cleaner equipped with a high-efficiency particulate air (HEPA) filter. The dust mite allergic individual should not be in a room which is being cleaned and should wait 1 hour after cleaning before going into the room.  4. Do not sleep on upholstered furniture (eg, couches).  5. If possible removing carpeting, upholstered furniture and drapery from the home is ideal. Horizontal blinds should be eliminated in the rooms where the person spends the most time (bedroom, study, television room). Washable vinyl, roller-type shades are optimal.  6. Remove all non-washable stuffed toys from the bedroom. Wash stuffed toys weekly like sheets and blankets above.  7. Reduce indoor humidity to less than 50%. Inexpensive humidity monitors can be purchased at most hardware stores. Do not use a humidifier as can make the problem worse and are not recommended.

## 2022-09-24 ENCOUNTER — Other Ambulatory Visit: Payer: Self-pay

## 2022-09-24 ENCOUNTER — Encounter: Payer: Self-pay | Admitting: Family Medicine

## 2022-09-24 ENCOUNTER — Ambulatory Visit: Payer: BC Managed Care – PPO | Admitting: Family Medicine

## 2022-09-24 VITALS — BP 122/70 | HR 61 | Temp 97.5°F | Resp 20 | Wt 188.0 lb

## 2022-09-24 DIAGNOSIS — J3089 Other allergic rhinitis: Secondary | ICD-10-CM

## 2022-09-24 DIAGNOSIS — J302 Other seasonal allergic rhinitis: Secondary | ICD-10-CM

## 2022-09-24 DIAGNOSIS — Z91018 Allergy to other foods: Secondary | ICD-10-CM

## 2022-09-24 DIAGNOSIS — L500 Allergic urticaria: Secondary | ICD-10-CM | POA: Diagnosis not present

## 2023-06-01 DIAGNOSIS — N200 Calculus of kidney: Secondary | ICD-10-CM | POA: Diagnosis not present

## 2023-06-01 DIAGNOSIS — Z8661 Personal history of infections of the central nervous system: Secondary | ICD-10-CM | POA: Diagnosis not present

## 2023-06-01 DIAGNOSIS — K219 Gastro-esophageal reflux disease without esophagitis: Secondary | ICD-10-CM | POA: Diagnosis not present

## 2023-06-01 DIAGNOSIS — E785 Hyperlipidemia, unspecified: Secondary | ICD-10-CM | POA: Diagnosis not present

## 2023-06-01 DIAGNOSIS — L500 Allergic urticaria: Secondary | ICD-10-CM | POA: Diagnosis not present

## 2023-06-01 DIAGNOSIS — Z125 Encounter for screening for malignant neoplasm of prostate: Secondary | ICD-10-CM | POA: Diagnosis not present

## 2023-06-07 DIAGNOSIS — J069 Acute upper respiratory infection, unspecified: Secondary | ICD-10-CM | POA: Diagnosis not present

## 2023-06-07 DIAGNOSIS — L500 Allergic urticaria: Secondary | ICD-10-CM | POA: Diagnosis not present

## 2023-06-07 DIAGNOSIS — Z0001 Encounter for general adult medical examination with abnormal findings: Secondary | ICD-10-CM | POA: Diagnosis not present

## 2023-06-07 DIAGNOSIS — K219 Gastro-esophageal reflux disease without esophagitis: Secondary | ICD-10-CM | POA: Diagnosis not present

## 2023-11-18 ENCOUNTER — Ambulatory Visit (HOSPITAL_COMMUNITY)
Admission: RE | Admit: 2023-11-18 | Discharge: 2023-11-18 | Disposition: A | Discharge status: Home / Self Care | Source: Ambulatory Visit | Attending: Internal Medicine | Admitting: Internal Medicine

## 2023-11-18 ENCOUNTER — Other Ambulatory Visit (HOSPITAL_COMMUNITY): Payer: Self-pay | Admitting: Internal Medicine

## 2023-11-18 DIAGNOSIS — R059 Cough, unspecified: Secondary | ICD-10-CM

## 2023-11-18 DIAGNOSIS — R079 Chest pain, unspecified: Secondary | ICD-10-CM | POA: Diagnosis not present

## 2023-11-18 DIAGNOSIS — R051 Acute cough: Secondary | ICD-10-CM | POA: Diagnosis not present

## 2024-03-06 ENCOUNTER — Ambulatory Visit (INDEPENDENT_AMBULATORY_CARE_PROVIDER_SITE_OTHER): Admitting: Sports Medicine

## 2024-03-06 ENCOUNTER — Ambulatory Visit (INDEPENDENT_AMBULATORY_CARE_PROVIDER_SITE_OTHER): Payer: Self-pay

## 2024-03-06 ENCOUNTER — Encounter: Payer: Self-pay | Admitting: Sports Medicine

## 2024-03-06 ENCOUNTER — Other Ambulatory Visit: Payer: Self-pay

## 2024-03-06 ENCOUNTER — Ambulatory Visit: Admitting: Physician Assistant

## 2024-03-06 DIAGNOSIS — M25552 Pain in left hip: Secondary | ICD-10-CM | POA: Diagnosis not present

## 2024-03-06 DIAGNOSIS — M160 Bilateral primary osteoarthritis of hip: Secondary | ICD-10-CM

## 2024-03-06 DIAGNOSIS — M25551 Pain in right hip: Secondary | ICD-10-CM | POA: Diagnosis not present

## 2024-03-06 DIAGNOSIS — G8929 Other chronic pain: Secondary | ICD-10-CM

## 2024-03-06 MED ORDER — LIDOCAINE HCL 1 % IJ SOLN
4.0000 mL | INTRAMUSCULAR | Status: AC | PRN
  Administered 2024-03-06: 4 mL

## 2024-03-06 MED ORDER — METHYLPREDNISOLONE ACETATE 40 MG/ML IJ SUSP
60.0000 mg | INTRAMUSCULAR | Status: AC | PRN
  Administered 2024-03-06: 60 mg via INTRA_ARTICULAR

## 2024-03-06 NOTE — Progress Notes (Signed)
   Office Visit Note   Patient: Glenn Ramos           Date of Birth: 09-09-1960           MRN: [id number] Visit Date: 03/06/2024              Requested by: Sheryle Carwin, MD [address] [place],  [place] [address] PCP: Sheryle Carwin, MD   Assessment & Plan: Visit Diagnoses:  1. Bilateral primary osteoarthritis of hip     Plan: Impression is bilateral hip degenerative joint disease left greater than right.  Today, we discussed various treatment options to include intra-articular cortisone injection versus hip replacement surgery.  He would like to first try cortisone injections.  We have made a referral to Dr. Burnetta for these.  He will follow-up with us  as needed.  Call with concerns or questions.  Follow-Up Instructions: Return if symptoms worsen or fail to improve.   Orders:  Orders Placed This Encounter  Procedures   XR HIPS BILAT W OR W/O PELVIS 3-4 VIEWS   No orders of the defined types were placed in this encounter.     Procedures: No procedures performed   Clinical Data: No additional findings.   Subjective: Chief Complaint  Patient presents with   Right Hip - Pain   Left Hip - Pain    HPI patient is a pleasant 63 year old gentleman who comes in today with bilateral hip pain.  Symptoms alternate between the left and right hips.  He has had pain for a few years which has progressively worsened over time.  He denies having had an injury to either hip.  All of this pain is to the groin.  He describes this as a constant ache which is aggravated by certain movements of the hips.  He has tried over-the-counter pain medication without relief.  No previous cortisone injection to either hip.  Review of Systems as detailed in HPI.  All others reviewed and are negative.   Objective: Vital Signs: There were no vitals taken for this visit.  Physical Exam well-developed well-nourished gentleman in no acute distress.  Alert and oriented x 3.  Ortho Exam bilateral  hip exam: Increased pain with logroll and FADIR testing.  He is neurovascularly intact distally.  Specialty Comments:  No specialty comments available.  Imaging: XR HIPS BILAT W OR W/O PELVIS 3-4 VIEWS Result Date: 03/06/2024 Moderate degenerative changes of the right hip joint.  Moderate to advanced degenerative changes left hip joint.    PMFS History: Patient Active Problem List   Diagnosis Date Noted   Allergy  to alpha-gal 09/24/2022   Allergic urticaria 07/16/2022   Seasonal and perennial allergic rhinitis 07/16/2022   Past Medical History:  Diagnosis Date   Meningitis spinal     No family history on file.  Past Surgical History:  Procedure Laterality Date   BRAIN SURGERY     SINOSCOPY     SPINE SURGERY     Social History   Occupational History   Not on file  Tobacco Use   Smoking status: Never   Smokeless tobacco: Never  Substance and Sexual Activity   Alcohol use: Never   Drug use: Never   Sexual activity: Not on file

## 2024-03-06 NOTE — Progress Notes (Signed)
   Procedure Note  Patient: Glenn Ramos             Date of Birth: 1960-09-14           MRN: [id number]             Visit Date: 03/06/2024  Procedures: Visit Diagnoses:  1. Bilateral primary osteoarthritis of hip    Large Joint Inj: R hip joint on 03/06/2024 3:08 PM Indications: pain Details: 22 G 3.5 in needle, ultrasound-guided anterior approach Medications: 4 mL lidocaine  1 %; 60 mg methylPREDNISolone  acetate 40 MG/ML Outcome: tolerated well, no immediate complications  Procedure: US -guided intra-articular hip injection, Right After discussion on risks/benefits/indications and informed verbal consent was obtained, a timeout was performed. Patient was lying supine on exam table. The hip was cleaned with betadine and alcohol swabs. Then utilizing ultrasound guidance, the patient's femoral head and neck junction was identified and subsequently injected with 4:1.5 lidocaine :depomedrol via an in-plane approach with ultrasound visualization of the injectate administered into the hip joint. Patient tolerated procedure well without immediate complications.  Procedure, treatment alternatives, risks and benefits explained, specific risks discussed. Consent was given by the patient. Immediately prior to procedure a time out was called to verify the correct patient, procedure, equipment, support staff and site/side marked as required. Patient was prepped and draped in the usual sterile fashion.    Large Joint Inj: L hip joint on 03/06/2024 3:09 PM Indications: pain Details: 22 G 3.5 in needle, ultrasound-guided anterior approach Medications: 4 mL lidocaine  1 %; 60 mg methylPREDNISolone  acetate 40 MG/ML Outcome: tolerated well, no immediate complications  Procedure: US -guided intra-articular hip injection, Left After discussion on risks/benefits/indications and informed verbal consent was obtained, a timeout was performed. Patient was lying supine on exam table. The hip was cleaned with betadine  and alcohol swabs. Then utilizing ultrasound guidance, the patient's femoral head and neck junction was identified and subsequently injected with 4:1.5 lidocaine :depomedrol via an in-plane approach with ultrasound visualization of the injectate administered into the hip joint. Patient tolerated procedure well without immediate complications.  Procedure, treatment alternatives, risks and benefits explained, specific risks discussed. Consent was given by the patient. Immediately prior to procedure a time out was called to verify the correct patient, procedure, equipment, support staff and site/side marked as required. Patient was prepped and draped in the usual sterile fashion.     - patient tolerated procedure well, discussed post-injection protocol - follow-up with Morna Nadene Cummins as indicated; I am happy to see them as needed  Lonell Sprang, DO Primary Care Sports Medicine Physician  [facility]  This note was dictated using Dragon naturally speaking software and may contain errors in syntax, spelling, or content which have not been identified prior to signing this note.

## 2024-05-07 ENCOUNTER — Encounter: Payer: Self-pay | Admitting: Radiology

## 2024-05-18 ENCOUNTER — Ambulatory Visit: Admitting: Orthopaedic Surgery

## 2024-05-18 DIAGNOSIS — M160 Bilateral primary osteoarthritis of hip: Secondary | ICD-10-CM | POA: Diagnosis not present

## 2024-05-18 NOTE — Progress Notes (Signed)
 Office Visit Note   Patient: Glenn Ramos           Date of Birth: 01-03-1961           MRN: [id number] Visit Date: 05/18/2024              Requested by: Sheryle Carwin, MD [address] [place],  [place] [address] PCP: Sheryle Carwin, MD   Assessment & Plan: Visit Diagnoses:  1. Bilateral primary osteoarthritis of hip     Plan: History of Present Illness [name] is a 63 year old male with bilateral hip arthritis who presents with persistent hip pain. He was referred by Dr. Burnetta for evaluation of hip pain and management options.  He received injections that provided relief for about a month. The right hip remains asymptomatic, but the left hip has returned to a constant ache with occasional short bursts of pain. The pain is persistent but not severely impacting his quality of life. He is waiting to finalize his insurance change before considering further interventions.  Results RADIOLOGY Hip X-ray: Left hip demonstrates more severe degeneration compared to the right hip.  Assessment and Plan Left hip osteoarthritis Chronic osteoarthritis with persistent pain. Previous steroid injection provided temporary relief, indicating advanced disease. Hip replacement likely inevitable but not urgent. - Advised to contact for surgical consultation when ready. - Plan new x-rays for surgical planning if pursued. - Obtain medical clearance if necessary at surgery time. - detailed surgical plan discussed  Right hip osteoarthritis Chronic osteoarthritis with persistent symptoms. Previous steroid injection provided temporary relief. No current surgical indication. - Monitor symptoms. - Consider surgery if symptoms worsen.  Follow-Up Instructions: No follow-ups on file.   Orders:  No orders of the defined types were placed in this encounter.  No orders of the defined types were placed in this encounter.     Procedures: No procedures performed   Clinical Data: No  additional findings.   Subjective: Chief Complaint  Patient presents with   Right Hip - Follow-up   Left Hip - Follow-up    HPI  Review of Systems  Constitutional: Negative.   HENT: Negative.    Eyes: Negative.   Respiratory: Negative.    Cardiovascular: Negative.   Gastrointestinal: Negative.   Endocrine: Negative.   Genitourinary: Negative.   Skin: Negative.   Allergic/Immunologic: Negative.   Neurological: Negative.   Hematological: Negative.   Psychiatric/Behavioral: Negative.    All other systems reviewed and are negative.    Objective: Vital Signs: There were no vitals taken for this visit.  Physical Exam Vitals and nursing note reviewed.  Constitutional:      Appearance: He is well-developed.  HENT:     Head: Normocephalic and atraumatic.  Eyes:     Pupils: Pupils are equal, round, and reactive to light.  Pulmonary:     Effort: Pulmonary effort is normal.  Abdominal:     Palpations: Abdomen is soft.  Musculoskeletal:        General: Normal range of motion.     Cervical back: Neck supple.  Skin:    General: Skin is warm.  Neurological:     Mental Status: He is alert and oriented to person, place, and time.  Psychiatric:        Behavior: Behavior normal.        Thought Content: Thought content normal.        Judgment: Judgment normal.     Ortho Exam  Specialty Comments:  No  specialty comments available.  Imaging: No results found.   PMFS History: Patient Active Problem List   Diagnosis Date Noted   Allergy  to alpha-gal 09/24/2022   Allergic urticaria 07/16/2022   Seasonal and perennial allergic rhinitis 07/16/2022   Past Medical History:  Diagnosis Date   Meningitis spinal     No family history on file.  Past Surgical History:  Procedure Laterality Date   BRAIN SURGERY     SINOSCOPY     SPINE SURGERY     Social History   Occupational History   Not on file  Tobacco Use   Smoking status: Never   Smokeless tobacco: Never   Substance and Sexual Activity   Alcohol use: Never   Drug use: Never   Sexual activity: Not on file

## 2024-06-08 DIAGNOSIS — L500 Allergic urticaria: Secondary | ICD-10-CM | POA: Diagnosis not present

## 2024-06-08 DIAGNOSIS — Z8661 Personal history of infections of the central nervous system: Secondary | ICD-10-CM | POA: Diagnosis not present

## 2024-06-08 DIAGNOSIS — K219 Gastro-esophageal reflux disease without esophagitis: Secondary | ICD-10-CM | POA: Diagnosis not present

## 2024-06-08 DIAGNOSIS — N200 Calculus of kidney: Secondary | ICD-10-CM | POA: Diagnosis not present

## 2024-06-08 DIAGNOSIS — Z125 Encounter for screening for malignant neoplasm of prostate: Secondary | ICD-10-CM | POA: Diagnosis not present

## 2024-06-15 DIAGNOSIS — Z0001 Encounter for general adult medical examination with abnormal findings: Secondary | ICD-10-CM | POA: Diagnosis not present

## 2024-06-15 DIAGNOSIS — K219 Gastro-esophageal reflux disease without esophagitis: Secondary | ICD-10-CM | POA: Diagnosis not present

## 2024-06-15 DIAGNOSIS — N200 Calculus of kidney: Secondary | ICD-10-CM | POA: Diagnosis not present

## 2024-06-15 DIAGNOSIS — Z8669 Personal history of other diseases of the nervous system and sense organs: Secondary | ICD-10-CM | POA: Diagnosis not present

## 2024-06-15 DIAGNOSIS — Z23 Encounter for immunization: Secondary | ICD-10-CM | POA: Diagnosis not present
